# Patient Record
Sex: Male | Born: 1965 | Race: Black or African American | Hispanic: No | Marital: Married | Smoking: Former smoker
Health system: Southern US, Community
[De-identification: ages and names within clinical notes are randomized; demographics above are authoritative.]

## PROBLEM LIST (undated history)

## (undated) DIAGNOSIS — M109 Gout, unspecified: Secondary | ICD-10-CM

## (undated) DIAGNOSIS — E119 Type 2 diabetes mellitus without complications: Secondary | ICD-10-CM

## (undated) DIAGNOSIS — E785 Hyperlipidemia, unspecified: Secondary | ICD-10-CM

## (undated) DIAGNOSIS — L0390 Cellulitis, unspecified: Secondary | ICD-10-CM

## (undated) DIAGNOSIS — I10 Essential (primary) hypertension: Secondary | ICD-10-CM

## (undated) DIAGNOSIS — B9562 Methicillin resistant Staphylococcus aureus infection as the cause of diseases classified elsewhere: Secondary | ICD-10-CM

## (undated) DIAGNOSIS — G4733 Obstructive sleep apnea (adult) (pediatric): Secondary | ICD-10-CM

## (undated) HISTORY — DX: Gout, unspecified: M10.9

## (undated) HISTORY — PX: SURGERY SCROTAL / TESTICULAR: SUR1316

## (undated) HISTORY — DX: Essential (primary) hypertension: I10

## (undated) HISTORY — DX: Cellulitis, unspecified: L03.90

## (undated) HISTORY — DX: Methicillin resistant Staphylococcus aureus infection as the cause of diseases classified elsewhere: B95.62

## (undated) HISTORY — PX: APPENDECTOMY: SHX54

## (undated) HISTORY — DX: Obstructive sleep apnea (adult) (pediatric): G47.33

## (undated) HISTORY — DX: Hyperlipidemia, unspecified: E78.5

## (undated) HISTORY — DX: Type 2 diabetes mellitus without complications: E11.9

---

## 1999-07-26 HISTORY — PX: OTHER SURGICAL HISTORY: SHX169

## 2009-06-16 ENCOUNTER — Emergency Department (HOSPITAL_COMMUNITY): Admission: EM | Admit: 2009-06-16 | Discharge: 2009-06-16 | Payer: Self-pay | Admitting: Emergency Medicine

## 2009-07-16 ENCOUNTER — Emergency Department (HOSPITAL_COMMUNITY): Admission: EM | Admit: 2009-07-16 | Discharge: 2009-07-16 | Payer: Self-pay | Admitting: Emergency Medicine

## 2009-07-30 ENCOUNTER — Ambulatory Visit: Payer: Self-pay | Admitting: Internal Medicine

## 2009-07-30 ENCOUNTER — Encounter (INDEPENDENT_AMBULATORY_CARE_PROVIDER_SITE_OTHER): Payer: Self-pay | Admitting: Dermatology

## 2009-07-30 DIAGNOSIS — S99929A Unspecified injury of unspecified foot, initial encounter: Secondary | ICD-10-CM

## 2009-07-30 DIAGNOSIS — E785 Hyperlipidemia, unspecified: Secondary | ICD-10-CM

## 2009-07-30 DIAGNOSIS — E1165 Type 2 diabetes mellitus with hyperglycemia: Secondary | ICD-10-CM

## 2009-07-30 DIAGNOSIS — R252 Cramp and spasm: Secondary | ICD-10-CM | POA: Insufficient documentation

## 2009-07-30 DIAGNOSIS — I10 Essential (primary) hypertension: Secondary | ICD-10-CM

## 2009-07-30 DIAGNOSIS — E118 Type 2 diabetes mellitus with unspecified complications: Secondary | ICD-10-CM

## 2009-07-30 DIAGNOSIS — R3911 Hesitancy of micturition: Secondary | ICD-10-CM | POA: Insufficient documentation

## 2009-07-30 DIAGNOSIS — S8990XA Unspecified injury of unspecified lower leg, initial encounter: Secondary | ICD-10-CM

## 2009-07-30 DIAGNOSIS — S99919A Unspecified injury of unspecified ankle, initial encounter: Secondary | ICD-10-CM

## 2009-07-30 DIAGNOSIS — R740 Nonspecific elevation of levels of transaminase and lactic acid dehydrogenase [LDH]: Secondary | ICD-10-CM

## 2009-07-30 LAB — CONVERTED CEMR LAB: Hgb A1c MFr Bld: 7.8 %

## 2009-07-31 LAB — CONVERTED CEMR LAB
ALT: 97 units/L — ABNORMAL HIGH (ref 0–53)
Albumin: 4.6 g/dL (ref 3.5–5.2)
BUN: 15 mg/dL (ref 6–23)
CO2: 21 meq/L (ref 19–32)
Calcium: 9.6 mg/dL (ref 8.4–10.5)
Creatinine, Ser: 0.92 mg/dL (ref 0.40–1.50)
Glucose, Bld: 151 mg/dL — ABNORMAL HIGH (ref 70–99)
Microalb Creat Ratio: 3.5 mg/g (ref 0.0–30.0)
PSA: 0.44 ng/mL (ref 0.10–4.00)
Potassium: 4.6 meq/L (ref 3.5–5.3)
Total Protein: 7.3 g/dL (ref 6.0–8.3)

## 2009-09-08 ENCOUNTER — Encounter (INDEPENDENT_AMBULATORY_CARE_PROVIDER_SITE_OTHER): Payer: Self-pay | Admitting: Dermatology

## 2009-09-18 ENCOUNTER — Ambulatory Visit: Payer: Self-pay | Admitting: Internal Medicine

## 2009-09-21 ENCOUNTER — Ambulatory Visit: Payer: Self-pay | Admitting: Internal Medicine

## 2009-09-22 LAB — CONVERTED CEMR LAB
Cholesterol: 186 mg/dL (ref 0–200)
HDL: 40 mg/dL (ref >39)
LDL Cholesterol: 132 mg/dL — ABNORMAL HIGH (ref 0–99)
Triglycerides: 71 mg/dL (ref <150)

## 2009-09-25 ENCOUNTER — Encounter (INDEPENDENT_AMBULATORY_CARE_PROVIDER_SITE_OTHER): Payer: Self-pay | Admitting: Dermatology

## 2009-10-07 ENCOUNTER — Telehealth (INDEPENDENT_AMBULATORY_CARE_PROVIDER_SITE_OTHER): Payer: Self-pay | Admitting: Dermatology

## 2009-10-07 ENCOUNTER — Ambulatory Visit: Payer: Self-pay | Admitting: Infectious Diseases

## 2009-10-07 DIAGNOSIS — S025XXA Fracture of tooth (traumatic), initial encounter for closed fracture: Secondary | ICD-10-CM | POA: Insufficient documentation

## 2009-10-07 LAB — CONVERTED CEMR LAB
ALT: 31 units/L (ref 0–53)
AST: 16 units/L (ref 0–37)
Alkaline Phosphatase: 99 units/L (ref 39–117)
Calcium: 8.8 mg/dL (ref 8.4–10.5)
Glucose, Bld: 236 mg/dL — ABNORMAL HIGH (ref 70–99)
Hgb A1c MFr Bld: 8.2 %
Sodium: 139 meq/L (ref 135–145)
Total Protein: 6.8 g/dL (ref 6.0–8.3)

## 2009-10-08 ENCOUNTER — Telehealth: Payer: Self-pay | Admitting: *Deleted

## 2009-12-25 ENCOUNTER — Ambulatory Visit: Payer: Self-pay | Admitting: Internal Medicine

## 2009-12-25 LAB — CONVERTED CEMR LAB
Blood Glucose, Fingerstick: 545
Calcium: 9 mg/dL (ref 8.4–10.5)
Glucose, Bld: 547 mg/dL — ABNORMAL HIGH (ref 70–99)
Hgb A1c MFr Bld: >14 %
Potassium: 4.3 meq/L (ref 3.5–5.3)
Sodium: 130 meq/L — ABNORMAL LOW (ref 135–145)

## 2009-12-29 ENCOUNTER — Telehealth (INDEPENDENT_AMBULATORY_CARE_PROVIDER_SITE_OTHER): Payer: Self-pay | Admitting: *Deleted

## 2010-08-15 ENCOUNTER — Encounter: Payer: Self-pay | Admitting: Dermatology

## 2010-08-24 NOTE — Miscellaneous (Signed)
Summary: HIPAA Restrictions  HIPAA Restrictions   Imported By: Florinda Marker 07/30/2009 16:24:04  _____________________________________________________________________  External Attachment:    Type:   Image     Comment:   External Document
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
## 2010-08-24 NOTE — Letter (Signed)
Summary: [facility] Medical Consultation  [facility] Medical Consultation   Imported By: Florinda Marker 09/10/2009 16:19:37  _____________________________________________________________________  External Attachment:    Type:   Image     Comment:   External Document

## 2010-08-24 NOTE — Progress Notes (Signed)
----   Converted from flag ---- ---- 10/08/2009 8:54 AM, Chinita Pester RN wrote: I called radiology and cancelled [place] per Dr. Doreen Beam.  ---- 10/08/2009 7:04 AM, Aris Lot MD wrote: Rivka Barbara, I am cancelling the RUQ [place] on this patient as his LFTs are normal. I will call him to make him aware of this. Thanks, Lorelle Gibbs ------------------------------

## 2010-08-24 NOTE — Progress Notes (Signed)
Summary: lipids/ hla  Phone Note Other Incoming   Summary of Call: pt presents c/o liver being bad, wants to see dr Scot Dock today, explained that it is not possible, appt made w/ dr Doreen Beam, explained hyperlipidemia, pt concerned Initial call taken by: Marin Roberts RN,  October 07, 2009 1:38 PM

## 2010-08-24 NOTE — Progress Notes (Signed)
Summary: diabetes support/follow up o fhigh blood sugar/dmr  Phone Note Outgoing Call   Call placed by: Jamison Neighbor RD,CDE,  December 29, 2009 9:24 AM Summary of Call: CDE saw patient on 12/25/09 to discuss insulin and self monitoring of blood glucoes/meter. left message for patient to call with questions or concerns.     Appended Document: diabetes support/follow up o fhigh blood sugar/dmr    Phone Note Call from Patient   Caller: Patient Summary of Call: Patient returned call: says he got one vial, had a 62 in middle of night, ate peanutbutter sandwich and was okay. He reports most blood sugars stil high in 200s. Expresses stress in learningn to cope with his diabetes, "frustrating" mentioned needing ot eat better, more balanced meals. Would like  information left at front desk on carbohydrates, meal planning. He plans to come by today to pick it up and hand in papers ot [facility].  Informaiton on carb counting, plate method and general diabetes left at front desk with CDE card with phone number.
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
## 2010-08-24 NOTE — Assessment & Plan Note (Signed)
Summary: f/u on hyperlipidemia/pcp-Justin Anthony/hla   Vital Signs:  Patient profile:   45 year old male Height:      67 inches (170.18 cm) Weight:      179.0 pounds (81.36 kg) BMI:     28.14 Temp:     97.4 degrees F oral Pulse rate:   81 / minute BP sitting:   131 / 89  (right arm)  Vitals Entered By: Chinita Pester RN (October 07, 2009 2:59 PM) CC: Needs to talk to MD about his "liver being damaged". Is Patient Diabetic? Yes Did you bring your meter with you today? No Pain Assessment Patient in pain? no      Nutritional Status BMI of 25 - 29 = overweight CBG Result 155  Have you ever been in a relationship where you felt threatened, hurt or afraid?No   Does patient need assistance? Functional Status Self care Ambulation Normal   Primary Care Provider:  Aris Lot MD  CC:  Needs to talk to MD about his "liver being damaged"..  History of Present Illness: 45 yo man with PMH as listed below who requested a walk-in appointment today to followup because he thinks his "liver is going bad."  Increased transaminases: Patient was new to clinic and had Cmet checked 07-2009 that showed AST 40, ALT 97, alk phos WNL. No etiologies could be detected as he was not using any meds or alcohol, had no history of liver disease, had no history of hepatitis. He did return for a followup appointment with Dr. Scot Dock at which time hepatitis panel showed that he is Hep B surface antigen, hep C antibody negative. HIV was also non-reactive. **On further history he tells me that he was taking an OTC med for erectile stamina called Stamina Rx and that although he was not currently taking Lipitor prior to when his LFTs were checked before, he tells me that he was taking it months before and he wonders if these 2 substances could have caused his LFTs to be elevated.    Hyperlipidemia: He had a lipid panel checked at last visit that showed that total chol 186, tri 71, HDL 40, LDL 132.   DMII: Dr. Scot Dock  asked him to use metformin 1000BID but he wanted to continue to use 500BID as higher doses had caused GI side effects. Last A1c was in january.   Difficulty Urinating: At last visit he wanted me to check his PSA because he had some difficulty urinating. PSA was normal. He says that this difficulty urinating has resolved.   Broken teeth: Patient has some broken teeth that are not really bothering him but that he is concerned about and that he would like a dentist to examine. He asks if there are any dental resources in the community.   Depression History:      The patient denies a depressed mood most of the day and a diminished interest in his usual daily activities.         Preventive Screening-Counseling & Management  Alcohol-Tobacco     Alcohol drinks/day: 0     Smoking Status: quit     Year Quit: 8-010  Caffeine-Diet-Exercise     Does Patient Exercise: no  Problems Prior to Update: 1)  Transaminases, Serum, Elevated  (ICD-790.4) 2)  Leg Cramps, Nocturnal  (ICD-729.82) 3)  Foot Injury, Left  (ICD-959.7) 4)  Urinary Hesitancy  (ICD-788.64) 5)  Hypertension, Benign  (ICD-401.1) 6)  Hyperlipidemia  (ICD-272.4) 7)  Dm  (ICD-250.00)  Current Medications (verified):  1)  Metformin Hcl 500 Mg Tabs (Metformin Hcl) .... Take 1 Tablet By Mouth Twice A Day 2)  Lisinopril 5 Mg Tabs (Lisinopril) .... Take 1 Tablet By Mouth Once A Day  Allergies: 1)  ! Morphine 2)  ! Benadryl  Review of Systems  The patient denies anorexia, fever, weight loss, weight gain, vision loss, decreased hearing, hoarseness, chest pain, syncope, dyspnea on exertion, peripheral edema, prolonged cough, headaches, hemoptysis, abdominal pain, melena, hematochezia, severe indigestion/heartburn, hematuria, incontinence, genital sores, muscle weakness, suspicious skin lesions, transient blindness, difficulty walking, depression, unusual weight change, abnormal bleeding, and enlarged lymph nodes.    Physical  Exam  General:  alert, well-developed, well-nourished, and well-hydrated.   Head:  normocephalic and atraumatic.   Eyes:  vision grossly intact, pupils equal, pupils round, and pupils reactive to light.   Ears:  no external deformities.   Nose:  no external deformity.   Mouth:  Patient has broken teeth in the front with no signs of infection. Lungs:  normal respiratory effort, no intercostal retractions, no accessory muscle use, normal breath sounds, no crackles, and no wheezes.   Heart:  normal rate, regular rhythm, no murmur, no gallop, and no rub.   Abdomen:  soft, non-tender, normal bowel sounds, and no hepatomegaly.   Extremities:  no edema Neurologic:  alert & oriented X3, cranial nerves II-XII intact, strength normal in all extremities, and sensation intact to light touch.   Psych:  Oriented X3, memory intact for recent and remote, normally interactive, good eye contact, not anxious appearing, and not depressed appearing.     Impression & Recommendations:  Problem # 1:  TRANSAMINASES, SERUM, ELEVATED (ICD-790.4) See HPI. Discussed case with Dr. Sampson Goon.  Hep panel and HIV negative. Not using ETOH. Plan to recheck LFTs today and order RUQ [place] to further eval.  I have asked that he stop taking this OTC Stamina RX stamina medicine since I am not sure if this could contribute to LFT abnormalities. He voiced understanding.    **I originally ordered RUQ [place] but have cancelled it now that I see his LFTs are normal. These can just be monitored at next visit.   Orders: T-Comprehensive Metabolic Panel ([id number])  Problem # 2:  HYPERLIPIDEMIA (ICD-272.4) LDL 132. Plan last visit was to start statin but I will wait until we have further evaluated his liver. This can be addressed at his next visit.  Problem # 3:  Preventive Health Care (ICD-V70.0) Refuses flu, pneumovax today. Says last tetanus was 2006. I do not have physical records of this but he says he is having his records sent  from his last doctor in [place].   Problem # 4:  BROKEN TOOTH (UJW-119.14) I gave him info for a reduced price dental clinic in [place]. No signs of infection today.   Problem # 5:  DM (ICD-250.00) Last A1c in January. Says he has been using metformin. Will check A1c today.  He refuses eye referral today.  **A1c 8.2. From documentation that his dentist sent, he was not taking his meds relaibly. He now has refills of his meds. Will recheck his A1c in 3 months with plans to add sulfonylurea if no improvement as he does not want his dose of metformin increased.   His updated medication list for this problem includes:    Metformin Hcl 500 Mg Tabs (Metformin hcl) .Marland Kitchen... Take 1 tablet by mouth twice a day    Lisinopril 5 Mg Tabs (Lisinopril) .Marland Kitchen... Take 1 tablet by mouth once  a day  Orders: T-Hgb A1C (in-house) ([id number])  Problem # 6:  HYPERTENSION, BENIGN (ICD-401.1) Continue Lisinopril. Checking metabolic panel today since I want to see what LFTs are anyway.   His updated medication list for this problem includes:    Lisinopril 5 Mg Tabs (Lisinopril) .Marland Kitchen... Take 1 tablet by mouth once a day  Complete Medication List: 1)  Metformin Hcl 500 Mg Tabs (Metformin hcl) .... Take 1 tablet by mouth twice a day 2)  Lisinopril 5 Mg Tabs (Lisinopril) .... Take 1 tablet by mouth once a day  Other Orders: Capillary Blood Glucose/CBG ([id number])  Patient Instructions: 1)  Please schedule and keep a fu appointment in  ~3 months to recheck your A1c.  2)  You will be contacted about a followup appointment for an abdominal ultrasound to evaluate your liver. Prescriptions: LISINOPRIL 5 MG TABS (LISINOPRIL) Take 1 tablet by mouth once a day  #31 x 5   Entered and Authorized by:   Aris Lot MD   Signed by:   Aris Lot MD on 10/07/2009   Method used:   Print then Give to Patient   RxID:   [id number] METFORMIN HCL 500 MG TABS (METFORMIN HCL) Take 1 tablet by mouth twice a day  #62 x 5    Entered and Authorized by:   Aris Lot MD   Signed by:   Aris Lot MD on 10/07/2009   Method used:   Print then Give to Patient   RxID:   [id number]   Prevention & Chronic Care Immunizations   Influenza vaccine: Not documented   Influenza vaccine deferral: Refused  (10/07/2009)    Tetanus booster: Not documented   Td booster deferral: Refused  (09/18/2009)    Pneumococcal vaccine: Not documented   Pneumococcal vaccine deferral: Refused  (10/07/2009)  Other Screening   Smoking status: quit  (10/07/2009)  Diabetes Mellitus   HgbA1C: 8.2  (10/07/2009)    Eye exam: Not documented   Diabetic eye exam action/deferral: Refused  (10/07/2009)    Foot exam: Not documented   Foot exam action/deferral: Deferred   High risk foot: Not documented   Foot care education: Not documented    Urine microalbumin/creatinine ratio: 3.5  (07/30/2009)    Diabetes flowsheet reviewed?: Yes   Progress toward A1C goal: Unchanged  Lipids   Total Cholesterol: 186  (09/21/2009)   LDL: 132  (09/21/2009)   LDL Direct: Not documented   HDL: 40  (09/21/2009)   Triglycerides: 71  (09/21/2009)    SGOT (AST): 40  (07/30/2009)   SGPT (ALT): 97  (07/30/2009) CMP ordered    Alkaline phosphatase: 89  (07/30/2009)   Total bilirubin: 0.3  (07/30/2009)    Lipid flowsheet reviewed?: Yes   Progress toward LDL goal: Unchanged  Hypertension   Last Blood Pressure: 131 / 89  (10/07/2009)   Serum creatinine: 0.92  (07/30/2009)   Serum potassium 4.6  (07/30/2009) CMP ordered     Hypertension flowsheet reviewed?: Yes   Progress toward BP goal: Unchanged  Self-Management Support :   Personal Goals (by the next clinic visit) :     Personal A1C goal: 7  (10/07/2009)     Personal blood pressure goal: 130/80  (10/07/2009)     Personal LDL goal: 100  (10/07/2009)    Patient will work on the following items until the next clinic visit to reach self-care goals:     Medications and  monitoring: bring all of my medications to every visit, examine my feet every  day  (10/07/2009)     Eating: use fresh or frozen vegetables, eat foods that are low in salt  (10/07/2009)    Diabetes self-management support: Written self-care plan  (10/07/2009)   Diabetes care plan printed    Hypertension self-management support: Written self-care plan  (10/07/2009)   Hypertension self-care plan printed.    Lipid self-management support: Written self-care plan  (10/07/2009)   Lipid self-care plan printed.  Process Orders Check Orders Results:     [facility]: ABN not required for this insurance Tests Sent for requisitioning (October 08, 2009 4:04 PM):     10/07/2009: [facility] -- T-Comprehensive Metabolic Panel [[id number]] (signed)    Laboratory Results   Blood Tests   Date/Time Received: October 07, 2009 4:18 PM  Date/Time Reported: Burke Keels  October 07, 2009 4:18 PM   HGBA1C: 8.2%   (Normal Range: Non-Diabetic - 3-6%   Control Diabetic - 6-8%) CBG Random:: 155mg /dL

## 2010-08-24 NOTE — Miscellaneous (Signed)
Summary: [facility]   Imported By: Florinda Marker 09/25/2009 16:43:51  _____________________________________________________________________  External Attachment:    Type:   Image     Comment:   External Document

## 2010-08-24 NOTE — Assessment & Plan Note (Signed)
Summary: ACUTE-RESCH FROM 09/15/09   Vital Signs:  Patient profile:   45 year old male Height:      67 inches Weight:      173.1 pounds BMI:     27.21 Temp:     97.5 degrees F oral Pulse rate:   93 / minute BP sitting:   128 / 86  (right arm)  Vitals Entered By: Filomena Jungling NT II (September 18, 2009 2:45 PM) CC: DOCTOR NEEDS TO  INITIAL PAPER FOR TEETH WORK, BP WAS HIGH, HAS A COUGH Pain Assessment Patient in pain? no      Nutritional Status BMI of 25 - 29 = overweight  Have you ever been in a relationship where you felt threatened, hurt or afraid?No   Does patient need assistance? Functional Status Self care Ambulation Normal   Primary Care Provider:  Aris Lot MD  CC:  DOCTOR NEEDS TO  INITIAL PAPER FOR TEETH WORK, BP WAS HIGH, and HAS A COUGH.  History of Present Illness: 45 y/o man with DM, HTN, HLD comes to the clinic requsting clearnce for a dental procedure as his BP was found high at his dentist's office. He does not have any complaints. He had not taking his medication fr 3 days prior to going tothe dentist's appointment due to financial reasons.   Current Medications (verified): 1)  Metformin Hcl 500 Mg Tabs (Metformin Hcl) .... Take 1 Tablet By Mouth Twice A Day 2)  Lisinopril 5 Mg Tabs (Lisinopril) .... Take 1 Tablet By Mouth Once A Day 3)  Azithromycin 250 Mg Tabs (Azithromycin) .... Take 2 Tabs Today. Then Take 1 Tab Daily For Three Days  Allergies (verified): 1)  ! * Itch  Review of Systems  The patient denies anorexia, fever, weight loss, weight gain, vision loss, decreased hearing, hoarseness, chest pain, syncope, dyspnea on exertion, peripheral edema, prolonged cough, headaches, hemoptysis, abdominal pain, melena, hematochezia, severe indigestion/heartburn, hematuria, incontinence, genital sores, muscle weakness, suspicious skin lesions, transient blindness, difficulty walking, depression, unusual weight change, abnormal bleeding, enlarged lymph  nodes, angioedema, breast masses, and testicular masses.    Physical Exam  General:  alert, well-developed, well-nourished, and well-hydrated.   Head:  normocephalic and atraumatic.   Eyes:  vision grossly intact, pupils equal, pupils round, and pupils reactive to light.   Ears:  R ear normal and L ear normal.   Nose:  no external deformity.   Neck:  supple.   Lungs:  normal respiratory effort, no accessory muscle use, normal breath sounds, no crackles, and no wheezes.   Heart:  normal rate, regular rhythm, no murmur, no gallop, and no rub.   Abdomen:  soft, non-tender, normal bowel sounds, and no distention.   Pulses:  PT/DP pulses 2+ bilaterally Neurologic:  alert & oriented X3, cranial nerves II-XII intact, and strength normal in all extremities.     Impression & Recommendations:  Problem # 1:  HYPERTENSION, BENIGN (ICD-401.1) His B is stable today. It is 120/70 on manual measurement. Will emphasize  medication complinace.  His updated medication list for this problem includes:    Lisinopril 5 Mg Tabs (Lisinopril) .Marland Kitchen... Take 1 tablet by mouth once a day  BP today: 128/86 Prior BP: 126/80 (07/30/2009)  Labs Reviewed: K+: 4.6 (07/30/2009) Creat: : 0.92 (07/30/2009)     Problem # 2:  TRANSAMINASES, SERUM, ELEVATED (ICD-790.4) He had multiple heterosexual partner in the past and has recived BT previously. Will check HIV and Hep B and C. He is not on  statins. Is not taking OTC advil or tylenol. Lisinopril could cause some elevation but was started recently. His HbA1C was around 11 previously as per his hisotry, so NASH could be the cause. Not alcaholic (quit 6 yrs ago) No sigsn/symtoms of decompensated liver injury.  Continue to monitor. Provide reassurance.  Future Orders: T-HIV Antibody  (Reflex) ([id number]) ... 09/21/2009 T-Hepatitis C Antibody ([id number]) ... 09/21/2009 T-Hepatitis B Surface Antigen [id number]) ... 09/21/2009  Problem # 3:  HYPERLIPIDEMIA  (ICD-272.4) Not fasting today. Will put fuure order for FLP in 2 days. Patient agrees. Not on any meds currently.  Future Orders: T-Lipid Profile [id number]) ... 09/21/2009  Labs Reviewed: SGOT: 40 (07/30/2009)   SGPT: 97 (07/30/2009)  Problem # 4:  DM (ICD-250.00) HbA1C not at goal yet. Would like to increase his metformin to 1000-500 but patient is reluctant as he had GI distress in the past from 1000 two times a day metformin. Explained to him that his could be a transient effect but he would like to continue with 500 two times a day. Will recheck A1C in 3 months from previous result.  His updated medication list for this problem includes:    Metformin Hcl 500 Mg Tabs (Metformin hcl) .Marland Kitchen... Take 1 tablet by mouth twice a day    Lisinopril 5 Mg Tabs (Lisinopril) .Marland Kitchen... Take 1 tablet by mouth once a day  Labs Reviewed: Creat: 0.92 (07/30/2009)    Reviewed HgBA1c results: 7.8 (07/30/2009)  Complete Medication List: 1)  Metformin Hcl 500 Mg Tabs (Metformin hcl) .... Take 1 tablet by mouth twice a day 2)  Lisinopril 5 Mg Tabs (Lisinopril) .... Take 1 tablet by mouth once a day 3)  Azithromycin 250 Mg Tabs (Azithromycin) .... Take 2 tabs today. then take 1 tab daily for three days  Patient Instructions: 1)  Please schedule a follow-up appointment in 1 month. Come back on Monday for cholesterol and liver blood work. Come fasting at that time 2)  It is important that you exercise regularly at least 20 minutes 5 times a week. If you develop chest pain, have severe difficulty breathing, or feel very tired , stop exercising immediately and seek medical attention. 3)  You need to lose weight. Consider a lower calorie diet and regular exercise.  4)  Get plenty of rest, drink lots of clear liquids, and use Tylenol or Ibuprofen for fever and comfort. Return in 7-10 days if you're not better:sooner if you're feeling worse. 5)  Take 650-1000mg  of Tylenol every 4-6 hours as needed for relief of pain or  comfort of fever AVOID taking more than 4000mg   in a 24 hour period (can cause liver damage in higher doses). Prescriptions: AZITHROMYCIN 250 MG TABS (AZITHROMYCIN) Take 2 tabs today. Then take 1 tab daily for three days  #5 x 0   Entered and Authorized by:   Bethel Born MD   Signed by:   Bethel Born MD on 09/18/2009   Method used:   Print then Give to Patient   RxID:   [id number]   Prevention & Chronic Care Immunizations   Influenza vaccine: Not documented   Influenza vaccine deferral: Refused  (07/30/2009)    Tetanus booster: Not documented   Td booster deferral: Refused  (09/18/2009)    Pneumococcal vaccine: Not documented  Other Screening   Smoking status: quit  (07/30/2009)  Diabetes Mellitus   HgbA1C: 7.8  (07/30/2009)    Eye exam: Not documented   Diabetic eye exam action/deferral: Deferred  (07/30/2009)  Foot exam: Not documented   Foot exam action/deferral: Deferred   High risk foot: Not documented   Foot care education: Not documented    Urine microalbumin/creatinine ratio: 3.5  (07/30/2009)    Diabetes flowsheet reviewed?: Yes   Progress toward A1C goal: Unchanged  Lipids   Total Cholesterol: Not documented   LDL: Not documented   LDL Direct: Not documented   HDL: Not documented   Triglycerides: Not documented    SGOT (AST): 40  (07/30/2009)   SGPT (ALT): 97  (07/30/2009)   Alkaline phosphatase: 89  (07/30/2009)   Total bilirubin: 0.3  (07/30/2009)    Lipid flowsheet reviewed?: Yes   Progress toward LDL goal: Unchanged  Hypertension   Last Blood Pressure: 128 / 86  (09/18/2009)   Serum creatinine: 0.92  (07/30/2009)   Serum potassium 4.6  (07/30/2009)    Hypertension flowsheet reviewed?: Yes   Progress toward BP goal: At goal  Self-Management Support :    Patient will work on the following items until the next clinic visit to reach self-care goals:     Medications and monitoring: take my medicines every day  (09/18/2009)      Eating: drink diet soda or water instead of juice or soda, eat more vegetables, use fresh or frozen vegetables, eat foods that are low in salt, eat baked foods instead of fried foods, eat fruit for snacks and desserts, limit or avoid alcohol  (09/18/2009)    Diabetes self-management support: Education handout, Resources for patients handout  (09/18/2009)   Diabetes education handout printed    Hypertension self-management support: Education handout, Resources for patients handout  (09/18/2009)   Hypertension education handout printed    Lipid self-management support: Education handout, Resources for patients handout  (09/18/2009)     Lipid education handout printed      Resource handout printed.   Process Orders Check Orders Results:     [facility]: ABN not required for this insurance Tests Sent for requisitioning (September 19, 2009 12:47 PM):     09/21/2009: [facility] -- T-Lipid Profile [id number] (signed)     09/21/2009: [facility] -- T-HIV Antibody  (Reflex) [[id number]] (signed)     09/21/2009: [facility] -- T-Hepatitis C Antibody [[id number]] (signed)     09/21/2009: [facility] -- T-Hepatitis B Surface Antigen [[id number]] (signed)

## 2010-10-10 LAB — GLUCOSE, CAPILLARY: Glucose-Capillary: 182 mg/dL — ABNORMAL HIGH (ref 70–99)

## 2010-10-11 LAB — GLUCOSE, CAPILLARY: Glucose-Capillary: 545 mg/dL — ABNORMAL HIGH (ref 70–99)

## 2010-10-25 LAB — CBC
HCT: 41 % (ref 39.0–52.0)
Hemoglobin: 14.1 g/dL (ref 13.0–17.0)
MCHC: 34.3 g/dL (ref 30.0–36.0)
MCV: 86 fL (ref 78.0–100.0)
RBC: 4.77 MIL/uL (ref 4.22–5.81)
WBC: 3.8 10*3/uL — ABNORMAL LOW (ref 4.0–10.5)

## 2010-10-25 LAB — DIFFERENTIAL
Basophils Relative: 1 % (ref 0–1)
Eosinophils Absolute: 0.1 10*3/uL (ref 0.0–0.7)
Lymphs Abs: 1.6 10*3/uL (ref 0.7–4.0)
Monocytes Absolute: 0.5 10*3/uL (ref 0.1–1.0)
Monocytes Relative: 12 % (ref 3–12)
Neutrophils Relative %: 44 % (ref 43–77)

## 2010-10-25 LAB — POCT I-STAT, CHEM 8
BUN: 12 mg/dL (ref 6–23)
Calcium, Ion: 1.09 mmol/L — ABNORMAL LOW (ref 1.12–1.32)
Chloride: 106 mEq/L (ref 96–112)
Creatinine, Ser: 1 mg/dL (ref 0.4–1.5)
Glucose, Bld: 153 mg/dL — ABNORMAL HIGH (ref 70–99)
HCT: 42 % (ref 39.0–52.0)
Potassium: 4.4 mEq/L (ref 3.5–5.1)

## 2010-12-19 ENCOUNTER — Encounter: Payer: Self-pay | Admitting: Internal Medicine

## 2011-04-03 ENCOUNTER — Emergency Department (HOSPITAL_COMMUNITY)
Admission: EM | Admit: 2011-04-03 | Discharge: 2011-04-04 | Disposition: A | Discharge status: Home / Self Care | Payer: Self-pay | Attending: Emergency Medicine | Admitting: Emergency Medicine

## 2011-04-03 DIAGNOSIS — S81009A Unspecified open wound, unspecified knee, initial encounter: Secondary | ICD-10-CM | POA: Insufficient documentation

## 2011-04-03 DIAGNOSIS — W540XXA Bitten by dog, initial encounter: Secondary | ICD-10-CM | POA: Insufficient documentation

## 2011-04-03 DIAGNOSIS — E119 Type 2 diabetes mellitus without complications: Secondary | ICD-10-CM | POA: Insufficient documentation

## 2011-04-03 DIAGNOSIS — S91309A Unspecified open wound, unspecified foot, initial encounter: Secondary | ICD-10-CM | POA: Insufficient documentation

## 2011-04-03 DIAGNOSIS — Z8614 Personal history of Methicillin resistant Staphylococcus aureus infection: Secondary | ICD-10-CM | POA: Insufficient documentation

## 2011-04-03 DIAGNOSIS — I10 Essential (primary) hypertension: Secondary | ICD-10-CM | POA: Insufficient documentation

## 2011-04-03 DIAGNOSIS — Z23 Encounter for immunization: Secondary | ICD-10-CM | POA: Insufficient documentation

## 2011-04-03 DIAGNOSIS — S61409A Unspecified open wound of unspecified hand, initial encounter: Secondary | ICD-10-CM | POA: Insufficient documentation

## 2011-04-07 ENCOUNTER — Inpatient Hospital Stay (INDEPENDENT_AMBULATORY_CARE_PROVIDER_SITE_OTHER)
Admission: RE | Admit: 2011-04-07 | Discharge: 2011-04-07 | Disposition: A | Discharge status: Home / Self Care | Payer: Self-pay | Source: Ambulatory Visit | Attending: Family Medicine | Admitting: Family Medicine

## 2011-04-07 DIAGNOSIS — Z23 Encounter for immunization: Secondary | ICD-10-CM

## 2011-04-12 ENCOUNTER — Inpatient Hospital Stay (INDEPENDENT_AMBULATORY_CARE_PROVIDER_SITE_OTHER)
Admission: RE | Admit: 2011-04-12 | Discharge: 2011-04-12 | Disposition: A | Discharge status: Home / Self Care | Payer: Self-pay | Source: Ambulatory Visit | Attending: Family Medicine | Admitting: Family Medicine

## 2011-04-12 DIAGNOSIS — Z23 Encounter for immunization: Secondary | ICD-10-CM

## 2011-04-18 ENCOUNTER — Inpatient Hospital Stay (INDEPENDENT_AMBULATORY_CARE_PROVIDER_SITE_OTHER)
Admission: RE | Admit: 2011-04-18 | Discharge: 2011-04-18 | Disposition: A | Discharge status: Home / Self Care | Payer: Self-pay | Source: Ambulatory Visit | Attending: Family Medicine | Admitting: Family Medicine

## 2011-04-18 DIAGNOSIS — Z23 Encounter for immunization: Secondary | ICD-10-CM

## 2011-07-05 ENCOUNTER — Encounter: Payer: Self-pay | Admitting: Internal Medicine

## 2011-07-11 ENCOUNTER — Encounter: Payer: Self-pay | Admitting: Internal Medicine

## 2011-07-14 ENCOUNTER — Encounter: Payer: Self-pay | Admitting: Internal Medicine

## 2011-07-14 ENCOUNTER — Ambulatory Visit (INDEPENDENT_AMBULATORY_CARE_PROVIDER_SITE_OTHER): Payer: Self-pay | Admitting: Internal Medicine

## 2011-07-14 VITALS — BP 141/94 | HR 70 | Temp 97.5°F | Ht 67.0 in | Wt 170.4 lb

## 2011-07-14 DIAGNOSIS — E119 Type 2 diabetes mellitus without complications: Secondary | ICD-10-CM

## 2011-07-14 DIAGNOSIS — I10 Essential (primary) hypertension: Secondary | ICD-10-CM

## 2011-07-14 DIAGNOSIS — E785 Hyperlipidemia, unspecified: Secondary | ICD-10-CM

## 2011-07-14 LAB — GLUCOSE, CAPILLARY: Glucose-Capillary: 253 mg/dL — ABNORMAL HIGH (ref 70–99)

## 2011-07-14 MED ORDER — PRAVASTATIN SODIUM 40 MG PO TABS: 40.0000 mg | ORAL_TABLET | Freq: Every day | ORAL | Status: DC

## 2011-07-14 MED ORDER — GLIPIZIDE 10 MG PO TABS: 10.0000 mg | ORAL_TABLET | Freq: Two times a day (BID) | ORAL | Status: AC

## 2011-07-14 MED ORDER — METFORMIN HCL 1000 MG PO TABS: 1000.0000 mg | ORAL_TABLET | Freq: Two times a day (BID) | ORAL | Status: AC

## 2011-07-14 MED ORDER — LISINOPRIL 5 MG PO TABS: 5.0000 mg | ORAL_TABLET | Freq: Every day | ORAL | Status: DC

## 2011-07-14 NOTE — Progress Notes (Signed)
  Subjective:    Patient ID: Justin Anthony, male    DOB: 1966/02/15, 45 y.o.   MRN: [id number]  HPI Justin Anthony is a 45 year old man with past with history of uncontrolled DM 2, hypertension, hyperlipidemia who comes the clinic for chief complain of nonproductive cough for past 2 and half weeks.  Justin Anthony comes to the clinic after a hiatus of 1.5 years. Justin Anthony was seen last in June 2011- when his HbA1c was more than 14. Justin Anthony was advised to start insulin- but Justin Anthony did not have insurance and says that Justin Anthony had issues with his social life and so stopped following with our clinic. Justin Anthony did not see any other doctors meanwhile. Also Justin Anthony did not take any insulin or lisinopril or Pravachol for past one half years.  His HbA1c today is more than 14 again- for which I discussed with him in detail for more than 30 minutes- about possible complications of severely uncontrolled diabetes including kidney failure, CAD, PVD. Justin Anthony says that Justin Anthony understands, but does not want to take any insulin. Justin Anthony would rather just take pills and control it with diet- to which I explained him that it would be really hard without insulin to control his diabetes. But Justin Anthony was adamant.  Today Justin Anthony complains of nonproductive cough for past 2 and half weeks- denies any shortness of breath, sputum production, fever, chills, nausea, vomiting, headache.  Justin Anthony does complain of blurry vision for past few months- and attributes it to high sugars.  Justin Anthony tried to explain me that Justin Anthony knows why Justin Anthony his diabetes is bad- that Justin Anthony is eating a lot of sugars and Justin Anthony will try to control it with decreasing sugar intake- and Justin Anthony knows people with diabetes who does not need insulin and are just controlled on diet.  Justin Anthony denies any abdominal pain, diarrhea, urinary abnormalities.  Justin Anthony does not want to see diabetic educator or use insulin.  Review of Systems    As per history of present illness, all other systems reviewed and negative.  Objective:   Physical Exam  General: NAD HEENT:  PERRL, EOMI, no scleral icterus. MMM, no erythema or exudates. Cardiac: S1, S2, RRR, no rubs, murmurs or gallops Pulm: clear to auscultation bilaterally, moving normal volumes of air Abd: soft, nontender, nondistended, BS present Ext: warm and well perfused, no pedal edema Neuro: alert and oriented X3, cranial nerves II-XII grossly intact       Assessment & Plan:

## 2011-07-14 NOTE — Assessment & Plan Note (Signed)
Lab Results  Component Value Date   NA 130* 12/25/2009   K 4.3 12/25/2009   CL 97 12/25/2009   CO2 25 12/25/2009   BUN 13 12/25/2009   CREATININE 1.08 12/25/2009    BP Readings from Last 3 Encounters:  07/14/11 141/94  12/25/09 117/84  10/07/09 131/89    Assessment: Hypertension control:  mildly elevated  Progress toward goals:  deteriorated Barriers to meeting goals:  nonadherence to medications  Plan: Hypertension treatment:  continue current medications Restart lisinopril 5 mg daily. Check CMP today.

## 2011-07-14 NOTE — Assessment & Plan Note (Addendum)
Lab Results  Component Value Date   HGBA1C >14.0 07/14/2011   HGBA1C >14.0 12/25/2009   CREATININE 1.08 12/25/2009   MICROALBUR 0.50 07/30/2009   MICRALBCREAT 3.5 07/30/2009   CHOL 186 09/21/2009   HDL 40 09/21/2009   TRIG 71 09/21/2009    Last eye exam and foot exam: No results found for this basename: HMDIABEYEEXA, HMDIABFOOTEX    Assessment: Diabetes control: not controlled Progress toward goals: unchanged Barriers to meeting goals: nonadherence to medications and lack of understanding of disease management  Plan: Diabetes treatment: As described in history of present illness, patient  don't want to use insulin and just wants to try oral pills . I will start him on metformin thousand milligram by mouth twice a day and glipizide 10 mg by mouth twice a day. And see him back in 3-4 months for repeat HbA1c.  I will check lipid panel, CMP, urine microalbumin/creatinine ratio. Refer to: none and He denies talking with diabetic educator- even after one 30 minutes of discussion about his severe uncontrolled diabetes and possible complications. Instruction/counseling given: reminded to bring medications to each visit and other instruction/counseling: More than 30 minutes of counseling given about diabetes.

## 2011-07-14 NOTE — Assessment & Plan Note (Signed)
Lipid profile today. Restart Pravachol 40 mg daily

## 2011-07-14 NOTE — Patient Instructions (Addendum)
Make followup appointment in 3-4 months. If anything else comes up meanwhile please make an early appointment.  Start taking metformin 1000 mg twice a day and glipizide 10 mg twice a day before breakfast and dinner.  Start taking Pravachol- 40 mg daily for your cholesterol  Start taking lisinopril 5 mg daily for blood pressure and protecting kidneys.  I will give you a call if any of the lab tests are abnormal.  Apply for the orange card as soon as possible.

## 2011-07-15 LAB — COMPLETE METABOLIC PANEL WITH GFR
Albumin: 4.1 g/dL (ref 3.5–5.2)
Alkaline Phosphatase: 111 U/L (ref 39–117)
BUN: 12 mg/dL (ref 6–23)
CO2: 24 mEq/L (ref 19–32)
Creat: 0.89 mg/dL (ref 0.50–1.35)
GFR, Est African American: >89 mL/min
GFR, Est Non African American: >89 mL/min
Glucose, Bld: 276 mg/dL — ABNORMAL HIGH (ref 70–99)
Sodium: 137 mEq/L (ref 135–145)
Total Bilirubin: 0.4 mg/dL (ref 0.3–1.2)
Total Protein: 6.9 g/dL (ref 6.0–8.3)

## 2011-07-15 LAB — LIPID PANEL
Cholesterol: 172 mg/dL (ref 0–200)
HDL: 37 mg/dL — ABNORMAL LOW (ref >39)
LDL Cholesterol: 126 mg/dL — ABNORMAL HIGH (ref 0–99)
Total CHOL/HDL Ratio: 4.6 Ratio

## 2011-07-15 LAB — MICROALBUMIN / CREATININE URINE RATIO
Creatinine, Urine: 219 mg/dL
Microalb Creat Ratio: 6 mg/g (ref 0.0–30.0)

## 2011-10-12 ENCOUNTER — Encounter: Payer: Self-pay | Admitting: Internal Medicine

## 2012-07-23 ENCOUNTER — Encounter: Payer: Self-pay | Admitting: Internal Medicine

## 2012-07-23 ENCOUNTER — Ambulatory Visit (INDEPENDENT_AMBULATORY_CARE_PROVIDER_SITE_OTHER): Payer: BC Managed Care – PPO | Admitting: Internal Medicine

## 2012-07-23 VITALS — BP 138/84 | HR 77 | Temp 97.9°F | Ht 68.0 in | Wt 159.0 lb

## 2012-07-23 DIAGNOSIS — I10 Essential (primary) hypertension: Secondary | ICD-10-CM

## 2012-07-23 DIAGNOSIS — M25569 Pain in unspecified knee: Secondary | ICD-10-CM

## 2012-07-23 DIAGNOSIS — M109 Gout, unspecified: Secondary | ICD-10-CM | POA: Insufficient documentation

## 2012-07-23 DIAGNOSIS — E119 Type 2 diabetes mellitus without complications: Secondary | ICD-10-CM

## 2012-07-23 DIAGNOSIS — G4733 Obstructive sleep apnea (adult) (pediatric): Secondary | ICD-10-CM | POA: Insufficient documentation

## 2012-07-23 LAB — GLUCOSE, POCT (MANUAL RESULT ENTRY): POC Glucose: 342 mg/dl — AB (ref 70–99)

## 2012-07-23 NOTE — Assessment & Plan Note (Addendum)
Diagnosed with diabetes around 2000, history of poor compliance Per chart review.  Hemoglobin A1c 14 last year, off insulin for at least 2 years. CBG today 342 I discussed with patient implications of poorly controlled diabetes including CAD, stroke, blindness, ED, renal failure , need of dialysis and early death He is very reluctant to take insulin (" don't like shots") ; in the past metformin  caused GI distress. Plan: Labs Lantus initiation today Addendum:  as I was explaining the plan of care, the  Patient told me that he absolutely refuses to take insulin, I also mentioned Amaryl but again he said that he won't take it because "it cause me to shake ". I advise him we could start with a very low-dose to prevent hypoglycemia, he still refused. Plan: A1c, refer to endocrinology

## 2012-07-23 NOTE — Patient Instructions (Addendum)
Will let you know about your results  tomorrow
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
## 2012-07-23 NOTE — Assessment & Plan Note (Addendum)
2 months history of knee pain, description is atypical, sounds more like neuropathy (meralgia . paresthetica) than DJD Plan: Control diabetes Referral ortho ?

## 2012-07-23 NOTE — Assessment & Plan Note (Signed)
H/o  hypertension since 1996, not ambulatory BPs, on no medications for at least one year. Plan: Labs Reassess and return to the office

## 2012-07-24 LAB — CBC WITH DIFFERENTIAL/PLATELET
Basophils Relative: 0.2 % (ref 0.0–3.0)
Eosinophils Relative: 2.2 % (ref 0.0–5.0)
HCT: 44.7 % (ref 39.0–52.0)
Hemoglobin: 15 g/dL (ref 13.0–17.0)
Lymphs Abs: 1.5 10*3/uL (ref 0.7–4.0)
MCHC: 33.6 g/dL (ref 30.0–36.0)
MCV: 86.3 fl (ref 78.0–100.0)
Monocytes Absolute: 0.3 10*3/uL (ref 0.1–1.0)
Neutro Abs: 2.3 10*3/uL (ref 1.4–7.7)
RBC: 5.18 Mil/uL (ref 4.22–5.81)
WBC: 4.2 10*3/uL — ABNORMAL LOW (ref 4.5–10.5)

## 2012-07-24 LAB — MICROALBUMIN / CREATININE URINE RATIO: Microalb, Ur: 1.4 mg/dL (ref 0.0–1.9)

## 2012-07-24 LAB — COMPREHENSIVE METABOLIC PANEL
ALT: 30 U/L (ref 0–53)
AST: 25 U/L (ref 0–37)
Alkaline Phosphatase: 100 U/L (ref 39–117)
BUN: 9 mg/dL (ref 6–23)
Creatinine, Ser: 0.9 mg/dL (ref 0.4–1.5)

## 2012-08-02 ENCOUNTER — Encounter: Payer: Self-pay | Admitting: Internal Medicine

## 2012-08-02 ENCOUNTER — Ambulatory Visit (INDEPENDENT_AMBULATORY_CARE_PROVIDER_SITE_OTHER): Payer: BC Managed Care – PPO | Admitting: Internal Medicine

## 2012-08-02 VITALS — BP 114/88 | HR 73 | Temp 98.3°F | Resp 16 | Ht 68.0 in | Wt 152.0 lb

## 2012-08-02 DIAGNOSIS — R21 Rash and other nonspecific skin eruption: Secondary | ICD-10-CM

## 2012-08-02 DIAGNOSIS — R300 Dysuria: Secondary | ICD-10-CM

## 2012-08-02 DIAGNOSIS — E118 Type 2 diabetes mellitus with unspecified complications: Secondary | ICD-10-CM

## 2012-08-02 DIAGNOSIS — E1165 Type 2 diabetes mellitus with hyperglycemia: Secondary | ICD-10-CM

## 2012-08-02 DIAGNOSIS — N489 Disorder of penis, unspecified: Secondary | ICD-10-CM

## 2012-08-02 MED ORDER — METFORMIN HCL ER 500 MG PO TB24: 1000.0000 mg | ORAL_TABLET | Freq: Two times a day (BID) | ORAL | Status: DC

## 2012-08-02 MED ORDER — SITAGLIPTIN PHOSPHATE 100 MG PO TABS: 100.0000 mg | ORAL_TABLET | Freq: Every day | ORAL | Status: DC

## 2012-08-02 NOTE — Patient Instructions (Addendum)
Please return in a month with your sugar. Call me with the meter brand covered by your insurance.
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
## 2012-08-03 ENCOUNTER — Telehealth: Payer: Self-pay | Admitting: Internal Medicine

## 2012-08-03 DIAGNOSIS — E1165 Type 2 diabetes mellitus with hyperglycemia: Secondary | ICD-10-CM

## 2012-08-03 LAB — URINALYSIS
Hgb urine dipstick: NEGATIVE
Leukocytes, UA: NEGATIVE
Nitrite: NEGATIVE
Specific Gravity, Urine: >1.03 — ABNORMAL HIGH (ref 1.005–1.030)
pH: 5 (ref 5.0–8.0)

## 2012-08-03 MED ORDER — CLOTRIMAZOLE 1 % EX CREA: TOPICAL_CREAM | Freq: Two times a day (BID) | CUTANEOUS | Status: DC

## 2012-08-03 MED ORDER — ONETOUCH ULTRASOFT LANCETS MISC: Status: DC

## 2012-08-03 MED ORDER — BLOOD GLUCOSE MONITORING SUPPL W/DEVICE KIT: PACK | Status: DC

## 2012-08-03 MED ORDER — GLUCOSE BLOOD VI STRP: ORAL_STRIP | Status: DC

## 2012-08-03 NOTE — Telephone Encounter (Signed)
Called pt to discuss test results - he mentioned his insurance would cover any meter+ lancets+strips we decide to use. Will send the Rx's to his pharmacy.

## 2012-08-03 NOTE — Telephone Encounter (Signed)
The patient called to report that the medication rx'd to him by Dr. Elvera Lennox today (08/03/12) is too expensive and he would like a return call to discuss other options.  Please call the patient at [phone].

## 2012-08-03 NOTE — Telephone Encounter (Signed)
He is referring to Januvia, not Lotrimin as I initially thought. It costs 300$ and he has to pay 70$ out of pocket but he cannot afford it. I think out only option now are samples - I asked him to come to the office and pick up Janumet XR - 28 tabs. He will come on Monday am.

## 2012-08-20 ENCOUNTER — Telehealth: Payer: Self-pay | Admitting: Internal Medicine

## 2012-08-20 DIAGNOSIS — A6002 Herpesviral infection of other male genital organs: Secondary | ICD-10-CM

## 2012-08-20 MED ORDER — ACYCLOVIR 400 MG PO TABS: 400.0000 mg | ORAL_TABLET | Freq: Three times a day (TID) | ORAL | Status: AC

## 2012-08-20 NOTE — Telephone Encounter (Signed)
Pt called back: - [facility] and [facility] with genital herpes - I was tx him with Lotrimin for presumed Candida infection, initially did better, then rash returned. - he stopped the Lotrimin, and would like a Rx of Zovirax - I directed him to PCP, but he would like to change his PCP - I will start him on Zovirax, but pt promised me he will get a new PCP in the next month so he can be followed for this and other med problems  Regarding his DM: - he is now taking Janumet XR (samples) 1000/100 mg and his sugars are lower: 170 this am, yesterday 188. He also tries to stay on the nr of carbs recommended - advised to go up in his Metformin dose by 500 mg daily and then by another 500 mg daily, as previously advised

## 2012-08-20 NOTE — Telephone Encounter (Signed)
CALLED PATIENT, NO ANSWER. LEFT MESSAGE THAT DR. GHERGHE WILL RETURN CALL.

## 2012-08-20 NOTE — Telephone Encounter (Signed)
The patient is requesting return call from Dr. Elvera Lennox.  Please call the patient at [phone].

## 2012-08-20 NOTE — Telephone Encounter (Signed)
Called pt back - no answer. Left VM to call me at [phone].

## 2012-09-03 ENCOUNTER — Encounter: Payer: Self-pay | Admitting: Internal Medicine

## 2012-09-03 ENCOUNTER — Ambulatory Visit (INDEPENDENT_AMBULATORY_CARE_PROVIDER_SITE_OTHER): Payer: BC Managed Care – PPO | Admitting: Internal Medicine

## 2012-09-03 VITALS — BP 122/88 | HR 86 | Temp 98.8°F | Resp 16 | Ht 68.0 in | Wt 153.0 lb

## 2012-09-03 DIAGNOSIS — E118 Type 2 diabetes mellitus with unspecified complications: Secondary | ICD-10-CM

## 2012-09-03 DIAGNOSIS — E1165 Type 2 diabetes mellitus with hyperglycemia: Secondary | ICD-10-CM

## 2012-09-03 NOTE — Patient Instructions (Addendum)
Please continue Janumer XR - i tablet with breakfast and 1 tablet with dinner. Please return in 1.5 months with your sugar log. Please call the [facility] office today to schedule a visit with your PCP. Telephone #: [phone]

## 2012-09-03 NOTE — Progress Notes (Signed)
Patient ID: Justin Anthony, male   DOB: August 20, 1965, 47 y.o.   MRN: [id number] Subjective:     Patient ID: Justin Anthony, male   DOB: [birth year], 47 y.o.   MRN: [id number]  HPI Mr. Justin Anthony is a 47 y/o AA man returning for f/u for DM2, uncontrolled, dx in 2000, insulin-dependent (but will not take insulin as he is afraid of needles), with complications (neuropathy).  Pt was dx with DM 14 years ago; he was  briefly on insulin 70/30 and at that time, his HbA1c was close to goal, but would not re-try it. He estimates that he was on medication for 3-4 years of all of the years since dx (14). At last visit, we started Metformin XR and Januvia. I gave him samples as he cannot afford the Januvia. He refused any type on insulin and also a VGo system.  He has continuously improving blood sugars, with an a.m. average of 220 3 weeks ago, 170 2 weeks ago, and 140 this past week. He initially checked sugars throughout the day, with elevated values before lunch, especially, but these improved in the last 2 weeks, too. He improved his diet, and tries to eat consistent amount of carbs for each meal.  He had increased urination with nocturia 2-3 x a night, large volumes and also had blurry vision. Both his nocturia and blurred vision resolved after starting diabetes medication. He tells me that he does have headaches and SOB since he started the meds. He could not tolerate the regular metformin, as it gave him nausea and diarrhea. He stopped the regular Metformin and is now just on combination Januvia 50 mg + Metformin XR 1000 mg daily.   Last eye exam was in 2008. No kidney disease. He has numbness and tingling in toes and fingers.   At last visit he had a rash on his penis and burning with urination. We tried Clotrimazole but w/o significant result. [facility] and he asked me to send Zovirax to his pharmacy, which I did. He promised that he would call and schedule an appt with a PCP as he does  not want to return to see Dr. Drue Novel anymore (he was offended by the honest discussion that Dr. Drue Novel had with him about possible complications of his diabetes).  I reviewed pt's medications, allergies, PMH, social hx, family hx and no changes required.  Review of Systems Constitutional: + weight loss - but this is stable from last visit, + fatigue but much improved, no subjective hyperthermia/hypothermia, decreased appetite Eyes: no more blurry vision, no xerophthalmia ENT: no sore throat, no nodules palpated in throat, no dysphagia/odynophagia, no hoarseness Cardiovascular: no CP/+ SOB/palpitations Respiratory: no cough/+ SOB Gastrointestinal: no N/V/D/C- but had nausea and rrheafrom regular Metformin Musculoskeletal: + muscle aches/+ joint aches, + bone pain, + joint swelling - left knee Skin: rash on penis, not resolved Neurological: no tremors/numbness/tingling/dizziness Psychiatric: no depression/anxiety GU: denies excessive urination Also see HPI Objective:   Physical Exam BP 122/88  Pulse 86  Temp(Src) 98.8 F (37.1 C) (Oral)  Resp 16  Ht 5\' 8"  (1.727 m)  Wt 153 lb (69.4 kg)  BMI 23.27 kg/m2  SpO2 97%     Wt Readings from Last 3 Encounters:  09/03/12 153 lb (69.4 kg)  08/02/12 152 lb (68.947 kg)  07/23/12 159 lb (72.122 kg)  Constitutional:  Normal weight, in NAD Eyes: PERRLA, EOMI, no exophthalmos ENT: moist mucous membranes, no thyromegaly, no cervical  lymphadenopathy Cardiovascular: RRR, No MRG Respiratory: CTA B Gastrointestinal: abdomen soft, NT, ND, BS+ Musculoskeletal: no deformities, strength intact in all 4, but he has some amyotrophy in thigh mm - pain at palpation on medial side L knee; also L knee pain Skin: moist, warm  Assessment:     1. DM2, very uncontrolled, insulin-dependent - noncompliance with insulin tx due to fear of needles - several med. intolerances: Amaryl > shaking (likely hypoglycemia), regular Metformin > nausea and diarrhea -  noncompliant with finger sticks in the past because of needle problem - but did well in last month  Plan:     1. DM2 - patient is doing much better, and he brings a sugar log that shows that he is checking his sugars 1-3 times a day along with adding comments about his diet and possible reasons why his sugars are high. - his sugars greatly improved and I congratulated the patient for his diabetic control and for keeping a good CBG log - his headache is probably due to his body getting used to lower sugars vs Januvia effect; I believe he should continue the medication as it appears to be working well for him and we really do not have any option for him in terms of oral meds (due to cost) - I gave him samples of JanuMet XR 50/1000 and I advised him to take twice a day with breakfast and dinner - I will see him back in about a month and a half, however he would need to check back with me in about a month for more samples - Will check hemoglobin A1c at next visit   2. Left knee pain - I strongly recommended that he calls the  [facility] and schedule an appointment with a new PCP for both his penile herpes in his left knee pain.  - It is possible that his left medial knee pain can be from muscle infarction due to uncontrolled diabetes. I explained that, if this is the case, the pain will resolve by itself and there is nothing else that we can do to speed up the process other than gaining control of his diabetes - however he might need referral to orthopedics, will await PCP recommendations

## 2012-10-30 ENCOUNTER — Ambulatory Visit (INDEPENDENT_AMBULATORY_CARE_PROVIDER_SITE_OTHER): Payer: BC Managed Care – PPO | Admitting: Internal Medicine

## 2012-10-30 ENCOUNTER — Other Ambulatory Visit: Payer: Self-pay | Admitting: Internal Medicine

## 2012-10-30 ENCOUNTER — Encounter: Payer: Self-pay | Admitting: Internal Medicine

## 2012-10-30 VITALS — BP 140/90 | HR 93 | Temp 98.0°F | Wt 143.0 lb

## 2012-10-30 DIAGNOSIS — E118 Type 2 diabetes mellitus with unspecified complications: Secondary | ICD-10-CM

## 2012-10-30 DIAGNOSIS — R634 Abnormal weight loss: Secondary | ICD-10-CM

## 2012-10-30 DIAGNOSIS — R61 Generalized hyperhidrosis: Secondary | ICD-10-CM

## 2012-10-30 DIAGNOSIS — M25562 Pain in left knee: Secondary | ICD-10-CM

## 2012-10-30 DIAGNOSIS — M549 Dorsalgia, unspecified: Secondary | ICD-10-CM

## 2012-10-30 DIAGNOSIS — R202 Paresthesia of skin: Secondary | ICD-10-CM

## 2012-10-30 DIAGNOSIS — R209 Unspecified disturbances of skin sensation: Secondary | ICD-10-CM

## 2012-10-30 LAB — CBC WITH DIFFERENTIAL/PLATELET
Basophils Relative: 0.8 % (ref 0.0–3.0)
Eosinophils Absolute: 0 10*3/uL (ref 0.0–0.7)
Hemoglobin: 14.7 g/dL (ref 13.0–17.0)
MCHC: 34 g/dL (ref 30.0–36.0)
MCV: 84.6 fl (ref 78.0–100.0)
Monocytes Absolute: 0.3 10*3/uL (ref 0.1–1.0)
Neutro Abs: 1.3 10*3/uL — ABNORMAL LOW (ref 1.4–7.7)
Neutrophils Relative %: 39 % — ABNORMAL LOW (ref 43.0–77.0)
RBC: 5.1 Mil/uL (ref 4.22–5.81)

## 2012-10-30 LAB — URINALYSIS
Nitrite: NEGATIVE
Total Protein, Urine: NEGATIVE
pH: 5.5 (ref 5.0–8.0)

## 2012-10-30 LAB — COMPREHENSIVE METABOLIC PANEL
ALT: 33 U/L (ref 0–53)
AST: 20 U/L (ref 0–37)
Calcium: 9.5 mg/dL (ref 8.4–10.5)
Chloride: 104 mEq/L (ref 96–112)
Creatinine, Ser: 0.9 mg/dL (ref 0.4–1.5)
Sodium: 140 mEq/L (ref 135–145)

## 2012-10-30 LAB — GLUCOSE, POCT (MANUAL RESULT ENTRY): POC Glucose: 136 mg/dl — AB (ref 70–99)
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
## 2012-10-30 NOTE — Patient Instructions (Addendum)
Keep taking the medications as you are doing --- Please get your x-ray at the other [facility]  office located at: [address] [place], across from [facility].  Please go to the basement, this is a walk-in facility, they are open from 8:30 to 5:30 PM. Phone number [phone].

## 2012-10-31 ENCOUNTER — Ambulatory Visit (INDEPENDENT_AMBULATORY_CARE_PROVIDER_SITE_OTHER)
Admission: RE | Admit: 2012-10-31 | Discharge: 2012-10-31 | Disposition: A | Discharge status: Home / Self Care | Payer: BC Managed Care – PPO | Source: Ambulatory Visit | Attending: Internal Medicine | Admitting: Internal Medicine

## 2012-10-31 DIAGNOSIS — M25562 Pain in left knee: Secondary | ICD-10-CM

## 2012-10-31 DIAGNOSIS — M549 Dorsalgia, unspecified: Secondary | ICD-10-CM

## 2012-10-31 DIAGNOSIS — R61 Generalized hyperhidrosis: Secondary | ICD-10-CM

## 2012-10-31 DIAGNOSIS — M25569 Pain in unspecified knee: Secondary | ICD-10-CM

## 2012-10-31 LAB — MANUAL DIFFERENTIAL
Atypical Lymphocytes Manual: 1 % — ABNORMAL HIGH
Bands Manual: 0 % (ref 0–5)
Blasts Manual: 0 %
Lymphocytes Manual: 52 % — ABNORMAL HIGH (ref 12–46)

## 2012-11-02 ENCOUNTER — Telehealth: Payer: Self-pay | Admitting: Internal Medicine

## 2012-11-02 DIAGNOSIS — R9389 Abnormal findings on diagnostic imaging of other specified body structures: Secondary | ICD-10-CM

## 2012-11-02 NOTE — Telephone Encounter (Addendum)
BMP, LFTs, HIV, RPR, vitamins all normal. CBC and blood smear smear show no abnormal cells, platelets were elevated. Back and knee x-rays normal Chest x-ray: 1. Mild hyperinflation can be seen with COPD. 2. Fairly dense nodular density projecting over the left 7th anterolateral rib may represent a granuloma or bone island. A pulmonary nodule cannot be definitively excluded. Comparison with prior chest radiographs would be helpful. In the absence of prior chest radiographs, follow-up PA and lateral views of the chest or CT chest without contrast could be performed.  Advise patient: Nothing in his labs account for symptoms, chest x-ray is abnormal, showing small spot. Recommend to do CT chest without , I entered the order.

## 2012-11-05 NOTE — Telephone Encounter (Signed)
Discussed with pt

## 2012-11-08 ENCOUNTER — Ambulatory Visit (INDEPENDENT_AMBULATORY_CARE_PROVIDER_SITE_OTHER)
Admission: RE | Admit: 2012-11-08 | Discharge: 2012-11-08 | Disposition: A | Discharge status: Home / Self Care | Payer: BC Managed Care – PPO | Source: Ambulatory Visit | Attending: Internal Medicine | Admitting: Internal Medicine

## 2012-11-08 DIAGNOSIS — R9389 Abnormal findings on diagnostic imaging of other specified body structures: Secondary | ICD-10-CM

## 2012-11-08 DIAGNOSIS — R918 Other nonspecific abnormal finding of lung field: Secondary | ICD-10-CM

## 2012-11-13 ENCOUNTER — Telehealth: Payer: Self-pay | Admitting: *Deleted

## 2012-11-13 NOTE — Telephone Encounter (Signed)
Message copied by Verdene Rio on Tue Nov 13, 2012  3:03 PM ------      Message from: Willow Ora E      Created: Sun Nov 11, 2012  3:50 PM       Will consider a PSA on RTC but recent DRE was normal.      Advise pt: CT showed no malignancy, recommend to RTC in 4 weeks from today.      Also he is due to see endocrinology, advise pt to call and schedule a visit ------

## 2012-11-13 NOTE — Telephone Encounter (Signed)
Needs to see Neurology as recommended. Please be sure He has an appointment. Needs to see endocrinology and followup w/ me as recommended.

## 2012-11-13 NOTE — Telephone Encounter (Signed)
Pt would like to know what he is to do about the pain. Pt is still c/o pain in top of thigh extending to groin area. .Please advise

## 2012-11-14 NOTE — Telephone Encounter (Signed)
Pt has an appt to see Dr. Smiley Houseman on 4.30.14. Pt would like to know if there is anything he can take for the pain until he see neurology. Please advise.

## 2012-11-14 NOTE — Telephone Encounter (Signed)
He complained of left knee pain, now is having pain in the groin, I need to see him, could he come tomorrow Thursday as an add on at 11.15?

## 2012-11-15 ENCOUNTER — Ambulatory Visit (INDEPENDENT_AMBULATORY_CARE_PROVIDER_SITE_OTHER): Payer: BC Managed Care – PPO | Admitting: Internal Medicine

## 2012-11-15 ENCOUNTER — Encounter: Payer: Self-pay | Admitting: Internal Medicine

## 2012-11-15 VITALS — BP 138/86 | HR 86 | Temp 98.2°F | Wt 138.0 lb

## 2012-11-15 DIAGNOSIS — R209 Unspecified disturbances of skin sensation: Secondary | ICD-10-CM

## 2012-11-15 DIAGNOSIS — R634 Abnormal weight loss: Secondary | ICD-10-CM

## 2012-11-15 DIAGNOSIS — E1165 Type 2 diabetes mellitus with hyperglycemia: Secondary | ICD-10-CM

## 2012-11-15 DIAGNOSIS — R202 Paresthesia of skin: Secondary | ICD-10-CM

## 2012-11-15 DIAGNOSIS — E118 Type 2 diabetes mellitus with unspecified complications: Secondary | ICD-10-CM

## 2012-11-15 MED ORDER — GABAPENTIN 300 MG PO CAPS: 300.0000 mg | ORAL_CAPSULE | Freq: Three times a day (TID) | ORAL | Status: DC
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
## 2012-11-15 NOTE — Telephone Encounter (Signed)
Left detailed msg for pt to return call.

## 2012-11-15 NOTE — Assessment & Plan Note (Signed)
diabetes, He d/c Januvia metformin 10/30/2012, thinks some of his symptoms are related to the medication, symptoms however have not decreased, he is reluctant to go back to take any medication for diabetes. Plan: We will arrange a visit with endocrinology.

## 2012-11-15 NOTE — Assessment & Plan Note (Signed)
  Generalized paresthesias, etiology unclear, has an appointment pending with neurology. Trial with Neurontin.

## 2012-11-15 NOTE — Assessment & Plan Note (Signed)
Weight loss Ongoing problem, labs and  CT of the chest were unrevealing. He has upper abdominal discomfort. Plan: CT Abdomen. GI referral

## 2012-11-15 NOTE — Telephone Encounter (Signed)
Spoke with pt, he is coming in today to see Dr. Drue Novel @ 1130a.

## 2012-11-15 NOTE — Patient Instructions (Addendum)
Neurontin 300 mg: Take one tablet at bedtime for one week, then one tablet twice a day for one week, then one tablet 3 times a day. Next visit with me 6 weeks, please schedule

## 2012-11-16 ENCOUNTER — Encounter: Payer: Self-pay | Admitting: Gastroenterology

## 2012-11-21 ENCOUNTER — Ambulatory Visit (INDEPENDENT_AMBULATORY_CARE_PROVIDER_SITE_OTHER): Payer: BC Managed Care – PPO | Admitting: Neurology

## 2012-11-21 ENCOUNTER — Ambulatory Visit (INDEPENDENT_AMBULATORY_CARE_PROVIDER_SITE_OTHER)
Admission: RE | Admit: 2012-11-21 | Discharge: 2012-11-21 | Disposition: A | Discharge status: Home / Self Care | Payer: BC Managed Care – PPO | Source: Ambulatory Visit | Attending: Internal Medicine | Admitting: Internal Medicine

## 2012-11-21 ENCOUNTER — Encounter: Payer: Self-pay | Admitting: Neurology

## 2012-11-21 VITALS — BP 130/78 | HR 86 | Temp 98.2°F | Resp 12 | Ht 67.0 in | Wt 139.0 lb

## 2012-11-21 DIAGNOSIS — E1144 Type 2 diabetes mellitus with diabetic amyotrophy: Secondary | ICD-10-CM

## 2012-11-21 DIAGNOSIS — E1149 Type 2 diabetes mellitus with other diabetic neurological complication: Secondary | ICD-10-CM

## 2012-11-21 DIAGNOSIS — R634 Abnormal weight loss: Secondary | ICD-10-CM

## 2012-11-21 DIAGNOSIS — G545 Neuralgic amyotrophy: Secondary | ICD-10-CM

## 2012-11-21 MED ORDER — AMITRIPTYLINE HCL 10 MG PO TABS: ORAL_TABLET | ORAL | Status: DC

## 2012-11-21 MED ORDER — METHYLPREDNISOLONE (PAK) 4 MG PO TABS: ORAL_TABLET | ORAL | Status: DC

## 2012-11-21 MED ORDER — IOHEXOL 300 MG/ML  SOLN
100.0000 mL | Freq: Once | INTRAMUSCULAR | Status: AC | PRN
  Administered 2012-11-21: 100 mL via INTRAVENOUS

## 2012-11-21 NOTE — Patient Instructions (Addendum)
Follow up with Dr. Smiley Houseman in one month.  Follow up with endocrinologist as soon as possible.
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
## 2012-11-27 ENCOUNTER — Ambulatory Visit (INDEPENDENT_AMBULATORY_CARE_PROVIDER_SITE_OTHER): Payer: BC Managed Care – PPO | Admitting: Internal Medicine

## 2012-11-27 ENCOUNTER — Encounter: Payer: Self-pay | Admitting: Internal Medicine

## 2012-11-27 VITALS — BP 124/82 | HR 88 | Temp 98.3°F | Resp 10 | Wt 138.0 lb

## 2012-11-27 DIAGNOSIS — E118 Type 2 diabetes mellitus with unspecified complications: Secondary | ICD-10-CM

## 2012-11-27 DIAGNOSIS — E1165 Type 2 diabetes mellitus with hyperglycemia: Secondary | ICD-10-CM

## 2012-11-27 LAB — HEMOGLOBIN A1C: Hgb A1c MFr Bld: 8.2 % — ABNORMAL HIGH (ref 4.6–6.5)

## 2012-11-27 MED ORDER — INSULIN PEN NEEDLE 31G X 8 MM MISC: Status: DC

## 2012-11-27 MED ORDER — INSULIN GLARGINE 100 UNIT/ML SOLOSTAR PEN: 12.0000 [IU] | PEN_INJECTOR | Freq: Every day | SUBCUTANEOUS | Status: DC

## 2012-11-27 NOTE — Progress Notes (Addendum)
Subjective:     Patient ID: Justin Anthony, male   DOB: September 07, 1965, 47 y.o.   MRN: [id number]  HPI Justin Anthony is a 47 y/o AA man returning for f/u for DM2, uncontrolled, dx in 2000, insulin-dependent (but will not take insulin as he is afraid of needles), with complications (neuropathy, amiotrophy). Last visit 3 mo ago, he missed his last appt.  Pt was dx with DM 14 years ago; he was  briefly on insulin 70/30 and at that time, his HbA1c was close to goal, but but recently he refused any type on insulin and also a VGo system. He has several po diabetes medication intolerances (to the most commonly used antidiabetics), and we cannot use more extensive medications because of his inability to pay.   We started metformin and Januvia at the beginning of the year, and initially he did really good and his medicines, although we had problems with the fact that he could only take metformin XR, and he relies on samples from our office, with inconsistent availability. He developed some abdominal pain especially in the left upper quadrant, and he ended up stopping all the DM medicines a month ago. He is currently off all diabetes medicines.    He checks sugars 2-3x a week - sugars have increased after he stopped his medicines, but they are still surprisingly well controlled: - am: 125-130 - pre-dinner: <140 He has changed his diet to include a lot of fruit.  The stomach pain continues, but a little better. His appetite is down. No nausea. Has constipation.  He continues to have pain in his left upper thigh. He has seen neurology (Dr. Smiley Houseman). Pt  thinks he has meralgia paresthetica, after he read about it online. He has amyotrophy. He has been given a prednisone taper, but he did not start it yet.  Last eye exam was in 2008 - I advised him several times to get the new eye exam, but he did not have a chance to do this. No kidney disease. He has numbness and tingling in toes and fingers.   I reviewed pt's  medications, allergies, PMH, social hx, family hx and no changes required, other than above.  Review of Systems Constitutional: + weight loss, no fatigue, no subjective hyperthermia/hypothermia, decreased appetite Eyes: no blurry vision, no xerophthalmia ENT: no sore throat, no nodules palpated in throat, no dysphagia/odynophagia, no hoarseness Cardiovascular: no CP/no SOB/palpitations Respiratory: no cough/no SOB Gastrointestinal: no N/V/D/C- but had nausea and rrheafrom regular Metformin Musculoskeletal: no muscle aches/joint aches/bone pain Skin: no rashes Neurological: no tremors/numbness/tingling/dizziness Psychiatric: no depression/anxiety  Objective:   Physical Exam BP 124/82  Pulse 88  Temp(Src) 98.3 F (36.8 C) (Oral)  Resp 10  Wt 138 lb (62.596 kg)  BMI 21.61 kg/m2  SpO2 98% Weight: 138 lb (62.596 kg)   Wt Readings from Last 3 Encounters:  11/27/12 138 lb (62.596 kg)  11/21/12 139 lb (63.05 kg)  11/15/12 138 lb (62.596 kg)  Constitutional:  thin, in NAD Eyes: PERRLA, EOMI, no exophthalmos ENT: moist mucous membranes, no thyromegaly, no cervical lymphadenopathy Cardiovascular: RRR, No MRG Respiratory: CTA B Gastrointestinal: abdomen soft, NT, ND, BS+ Musculoskeletal: no deformities, strength intact in all 4 Skin: moist, warm  Assessment:     1. DM2, uncontrolled, insulin-dependent, refused insulin in the past - noncompliance with insulin tx due to fear of needles - several med. intolerances:   Amaryl >> shaking (likely hypoglycemia)  regular Metformin >> nausea and diarrhea  Januvia >> stomachache  Metformin XR >> stomachache - noncompliant with finger sticks in the past  - poor finances  Plan:     1. DM2 - Pt's blood sugars are surprisingly well controlled on no medications. He denies increased thirst and urination, however he continues to lose weight. This has been investigated by PCP with a CT scan of the abdomen and chest, with no concerning  findings. He has diabetic amyotrophy, which can be associated with weight loss. - since he is on no diabetic medicines, but his abdominal pain persists despite stopping the medicines approximately a month ago. She will have an appointment with Dr. Russella Dar (GI) in 2 weeks. He is reticent to restart the medicines. We discussed about possible options and he brought in to discussion insulin - he appears more open to the idea especially since I told him that we can start with one injection a day. - We decided to start with Lantus 12 units at bedtime - I demonstrated how to use the Lantus pens, and sent the prescription to his pharmacy along with pen needles - given brochure regarding pen use and injection sites - I gave him new sugar logs and advised him to check his sugars at least twice a day - I will see him back in a month with his sugar log - I again advised him to join my chart - I also advised him to be back in about a week with his sugars - He will start the prednisone taper after the start of Lantus  2. Weight loss - commonly associated with  diabetic amyotrophy - due to his weight loss, I would check anti-pancreatic antibodies, to see if patient developed LADA - I am very surprised that his sugars are so good without any medicines.Marland Kitchen.(honeymoon period for LADA?)  Office Visit on 11/27/2012  Component Date Value Range Status  . Hemoglobin A1C 11/27/2012 8.2* 4.6 - 6.5 % Final   Glycemic Control Guidelines for People with Diabetes:Non Diabetic:  <6%Goal of Therapy: <7%Additional Action Suggested:  >8%   . Glutamic Acid Decarb Ab 11/27/2012 <1.0  <=1.0 U/mL Final  . Pancreatic Islet Cell Antibody 11/27/2012 <5  < 5 JDF Units Final   Comment:                             Laboratory Developed Test performed using a reagent labeled by                           the manufacturer as ASR Class I or ASR Class II (non-blood bank).                                                      This test(s) was  developed and its performance characteristics                           have been determined by [facility],                           [place], [place]. It has not been cleared or approved by the U.S.  Food and Drug Administration. The FDA has determined that such                           clearance or approval is not necessary. Performance                           characteristics refer to the analytical performance of the test.   Letter sent. - likely not LADA or DM1. Dramatic improvement of HbA1C.

## 2012-11-27 NOTE — Patient Instructions (Addendum)
Please start Lantus 12 units before bedtime. Check your sugars at least twice a day and write them down. Please return in 1 month with your sugar log.

## 2012-11-30 LAB — GLUTAMIC ACID DECARBOXYLASE AUTO ABS: Glutamic Acid Decarb Ab: <1 U/mL (ref <=1.0)

## 2012-12-04 LAB — ANTI-ISLET CELL ANTIBODY: Pancreatic Islet Cell Antibody: <5 JDF Units (ref <5)

## 2012-12-06 ENCOUNTER — Encounter: Payer: Self-pay | Admitting: Internal Medicine

## 2012-12-10 ENCOUNTER — Encounter: Payer: Self-pay | Admitting: Gastroenterology

## 2012-12-10 ENCOUNTER — Ambulatory Visit (INDEPENDENT_AMBULATORY_CARE_PROVIDER_SITE_OTHER): Payer: BC Managed Care – PPO | Admitting: Gastroenterology

## 2012-12-10 VITALS — BP 112/86 | HR 92 | Ht 67.0 in | Wt 139.5 lb

## 2012-12-10 DIAGNOSIS — R109 Unspecified abdominal pain: Secondary | ICD-10-CM

## 2012-12-10 DIAGNOSIS — R198 Other specified symptoms and signs involving the digestive system and abdomen: Secondary | ICD-10-CM

## 2012-12-10 DIAGNOSIS — R634 Abnormal weight loss: Secondary | ICD-10-CM

## 2012-12-10 MED ORDER — OMEPRAZOLE 20 MG PO CPDR: 20.0000 mg | DELAYED_RELEASE_CAPSULE | Freq: Every day | ORAL | Status: DC

## 2012-12-10 NOTE — Progress Notes (Signed)
History of Present Illness: This is a 47 year old male who relates upper abdominal pain, a loss of appetite, a 20 pound weight loss and thinner stools. He notes cramping and soreness in his upper abdomen. For a few weeks he had pain in his left abdomen but for the past several weeks the pain has been localized to his upper abdomen Occasionally he notices spasms in his upper abdominal wall muscles. He states his symptoms began after beginning Janumet but he has since discontinued this medication and his symptoms have persisted. His hemoglobin A1c was 15.4 in December and his most recent was 8.2. He states he currently is not following his blood sugars at home as he is out of blood glucose strips. Recent CT of the abdomen/pelvis was negative. Denies constipation, diarrhea, melena, hematochezia, nausea, vomiting, dysphagia, reflux symptoms, chest pain.  Review of Systems: Pertinent positive and negative review of systems were noted in the above HPI section. All other review of systems were otherwise negative.  Current Medications, Allergies, Past Medical History, Past Surgical History, Family History and Social History were reviewed in [employer] [facility] record.  Physical Exam: General: Well developed , well nourished, thin, no acute distress Head: Normocephalic and atraumatic Eyes:  sclerae anicteric, EOMI Ears: Normal auditory acuity Mouth: No deformity or lesions Neck: Supple, no masses or thyromegaly Lungs: Clear throughout to auscultation Heart: Regular rate and rhythm; no murmurs, rubs or bruits Abdomen: Soft, mild upper abdominal tenderness to deep palpation without rebound or guarding and non distended. No masses, hepatosplenomegaly or hernias noted. Normal Bowel sounds Rectal: Deferred to colonoscopy Musculoskeletal: Symmetrical with no gross deformities  Skin: No lesions on visible extremities Pulses:  Normal pulses noted Extremities: No clubbing, cyanosis, edema or  deformities noted Neurological: Alert oriented x 4, grossly nonfocal Cervical Nodes:  No significant cervical adenopathy Inguinal Nodes: No significant inguinal adenopathy Psychological:  Alert and cooperative. Wandering history. Normal mood and affect  Assessment and Recommendations:  1. Unexplained upper abdominal pain, anorexia, weight loss and change in stool caliber. Rule out colorectal neoplasms ulcer disease gastritis and other disorders. A component of his upper abdominal pain and cramping could be musculoskeletal. Begin omeprazole 20 mg daily. Obtain stool Hemoccults. The risks, benefits, and alternatives to colonoscopy with possible biopsy and possible polypectomy were discussed with the patient and they consent to proceed. The risks, benefits, and alternatives to endoscopy with possible biopsy and possible dilation were discussed with the patient and they consent to proceed.

## 2012-12-10 NOTE — Patient Instructions (Addendum)
It has been recommended to you by your physician that you have a(n) endoscopy/colonoscopy completed. Per your request, we did not schedule the procedure(s) today. Please contact our office at [phone] should you decide to have the procedure completed.    Samples of Omeprazole/Prilosec was given today. Please take one tablet by mouth once daily. If this works well for you it can be purchased over the counter.

## 2012-12-11 ENCOUNTER — Other Ambulatory Visit: Payer: Self-pay | Admitting: Gastroenterology

## 2012-12-11 DIAGNOSIS — K921 Melena: Secondary | ICD-10-CM

## 2012-12-11 NOTE — Progress Notes (Signed)
After leaving the office appt, Dr Russella Dar states he wants patient to have Hemoccult cards. Called patient and told him I will mail him Hemoccult cards and to fill them out and mail them back to [place] when finished.

## 2012-12-13 ENCOUNTER — Ambulatory Visit: Payer: BC Managed Care – PPO | Admitting: Internal Medicine

## 2012-12-19 ENCOUNTER — Encounter: Payer: BC Managed Care – PPO | Admitting: Gastroenterology

## 2012-12-21 ENCOUNTER — Ambulatory Visit: Payer: BC Managed Care – PPO | Admitting: Neurology

## 2012-12-27 ENCOUNTER — Encounter: Payer: BC Managed Care – PPO | Admitting: Gastroenterology

## 2013-01-01 ENCOUNTER — Ambulatory Visit: Payer: BC Managed Care – PPO | Admitting: Internal Medicine

## 2013-01-31 ENCOUNTER — Other Ambulatory Visit: Payer: Self-pay

## 2014-09-13 ENCOUNTER — Emergency Department (HOSPITAL_COMMUNITY)
Admission: EM | Admit: 2014-09-13 | Discharge: 2014-09-14 | Disposition: A | Discharge status: Home / Self Care | Payer: Self-pay | Attending: Emergency Medicine | Admitting: Emergency Medicine

## 2014-09-13 ENCOUNTER — Encounter (HOSPITAL_COMMUNITY): Payer: Self-pay

## 2014-09-13 DIAGNOSIS — Z8614 Personal history of Methicillin resistant Staphylococcus aureus infection: Secondary | ICD-10-CM | POA: Insufficient documentation

## 2014-09-13 DIAGNOSIS — I10 Essential (primary) hypertension: Secondary | ICD-10-CM | POA: Insufficient documentation

## 2014-09-13 DIAGNOSIS — Z794 Long term (current) use of insulin: Secondary | ICD-10-CM | POA: Insufficient documentation

## 2014-09-13 DIAGNOSIS — Z79899 Other long term (current) drug therapy: Secondary | ICD-10-CM | POA: Insufficient documentation

## 2014-09-13 DIAGNOSIS — Z87891 Personal history of nicotine dependence: Secondary | ICD-10-CM | POA: Insufficient documentation

## 2014-09-13 DIAGNOSIS — Z8669 Personal history of other diseases of the nervous system and sense organs: Secondary | ICD-10-CM | POA: Insufficient documentation

## 2014-09-13 DIAGNOSIS — E119 Type 2 diabetes mellitus without complications: Secondary | ICD-10-CM | POA: Insufficient documentation

## 2014-09-13 DIAGNOSIS — R0602 Shortness of breath: Secondary | ICD-10-CM | POA: Insufficient documentation

## 2014-09-13 DIAGNOSIS — M25462 Effusion, left knee: Secondary | ICD-10-CM | POA: Insufficient documentation

## 2014-09-13 DIAGNOSIS — R0789 Other chest pain: Secondary | ICD-10-CM | POA: Insufficient documentation

## 2014-09-13 DIAGNOSIS — Z7982 Long term (current) use of aspirin: Secondary | ICD-10-CM | POA: Insufficient documentation

## 2014-09-13 DIAGNOSIS — R079 Chest pain, unspecified: Secondary | ICD-10-CM

## 2014-09-13 LAB — I-STAT TROPONIN, ED: TROPONIN I, POC: 0.01 ng/mL (ref 0.00–0.08)

## 2014-09-13 LAB — CBC
HCT: 44.1 % (ref 39.0–52.0)
Hemoglobin: 15.3 g/dL (ref 13.0–17.0)
MCH: 29.5 pg (ref 26.0–34.0)
MCHC: 34.7 g/dL (ref 30.0–36.0)
MCV: 85 fL (ref 78.0–100.0)
PLATELETS: 373 10*3/uL (ref 150–400)
RBC: 5.19 MIL/uL (ref 4.22–5.81)
RDW: 12.6 % (ref 11.5–15.5)
WBC: 4 10*3/uL (ref 4.0–10.5)

## 2014-09-13 NOTE — ED Notes (Signed)
Patient reports intermittent left-sided chest pain that started yesterday.  Denies shortness of breath, nausea/vomiting.

## 2014-09-13 NOTE — ED Notes (Signed)
Patient was diagnosed with GERD 2 years ago and was prescribed prilosec which he does not take. Patient admits that he does not take any medications for his diabetes or hypertension. I attempted to educate that patient about complications and offered written material. Patient declined stating that he has heard it all before and just rather not be bothered with it.

## 2014-09-14 ENCOUNTER — Emergency Department (HOSPITAL_COMMUNITY): Payer: Self-pay

## 2014-09-14 LAB — D-DIMER, QUANTITATIVE (NOT AT ARMC)

## 2014-09-14 LAB — BASIC METABOLIC PANEL
Anion gap: 9 (ref 5–15)
BUN: 14 mg/dL (ref 6–23)
CALCIUM: 9.3 mg/dL (ref 8.4–10.5)
CO2: 25 mmol/L (ref 19–32)
Chloride: 100 mmol/L (ref 96–112)
Creatinine, Ser: 0.9 mg/dL (ref 0.50–1.35)
GFR calc Af Amer: >90 mL/min (ref >90)
Glucose, Bld: 225 mg/dL — ABNORMAL HIGH (ref 70–99)
Potassium: 3.7 mmol/L (ref 3.5–5.1)
Sodium: 134 mmol/L — ABNORMAL LOW (ref 135–145)

## 2014-09-14 LAB — TROPONIN I

## 2014-09-14 MED ORDER — ASPIRIN 81 MG PO CHEW: 81.0000 mg | CHEWABLE_TABLET | Freq: Every day | ORAL | Status: AC

## 2014-09-14 MED ORDER — METHOCARBAMOL 500 MG PO TABS: 1000.0000 mg | ORAL_TABLET | Freq: Three times a day (TID) | ORAL | Status: DC | PRN

## 2014-09-14 NOTE — ED Notes (Signed)
Patient transported to X-ray via stretcher Patient in NAD upon leaving ED for testing 

## 2014-09-14 NOTE — ED Notes (Signed)
Patient's wife wanting to know the results of the first lab work before the patient is stuck again for any labs. Patient's wife states" This place always wants to nickel and dime ya before anything gets done".
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
## 2014-09-14 NOTE — ED Notes (Signed)
ED Charge nurse made aware that patient has refused additional labs

## 2014-09-14 NOTE — ED Notes (Signed)
Patient rude and interruptive during DC process Patient would not let this nurse completely review DC instructions or obtain DC VS Patient stated that he wanted PIV taken out so he could leave Patient rolling his eyes and shaking his head when this nurse [facility] Patient then stated, "I didn't want to come here anyway." Patient in NAD at time of DC Patient refused to sign DC papers ED Charge aware

## 2014-09-14 NOTE — ED Notes (Signed)
Informed by phlebotomy that patient and pt's wife would like to speak with Dr. Ranae PalmsYelverton before additional labs are drawn Will make EDP aware

## 2014-09-14 NOTE — Discharge Instructions (Signed)
Chest Wall Pain Chest wall pain is pain in or around the bones and muscles of your chest. It may take up to 6 weeks to get better. It may take longer if you must stay physically active in your work and activities.  CAUSES  Chest wall pain may happen on its own. However, it may be caused by:  A viral illness like the flu.  Injury.  Coughing.  Exercise.  Arthritis.  Fibromyalgia.  Shingles. HOME CARE INSTRUCTIONS   Avoid overtiring physical activity. Try not to strain or perform activities that cause pain. This includes any activities using your chest or your abdominal and side muscles, especially if heavy weights are used.  Put ice on the sore area.  Put ice in a plastic bag.  Place a towel between your skin and the bag.  Leave the ice on for 15-20 minutes per hour while awake for the first 2 days.  Only take over-the-counter or prescription medicines for pain, discomfort, or fever as directed by your caregiver. SEEK IMMEDIATE MEDICAL CARE IF:   Your pain increases, or you are very uncomfortable.  You have a fever.  Your chest pain becomes worse.  You have new, unexplained symptoms.  You have nausea or vomiting.  You feel sweaty or lightheaded.  You have a cough with phlegm (sputum), or you cough up blood. MAKE SURE YOU:   Understand these instructions.  Will watch your condition.  Will get help right away if you are not doing well or get worse. Document Released: 07/11/2005 Document Revised: 10/03/2011 Document Reviewed: 03/07/2011 [facility] Patient Information 2015 [employer], [place]. This information is not intended to replace advice given to you by your health care provider. Make sure you discuss any questions you have with your health care provider.   Given your risk factors for coronary artery disease, you need to follow-up with a cardiologist. You may need further testing. Return immediately for worsening pain or for any concerns.

## 2014-09-14 NOTE — ED Notes (Signed)
MD at bedside. 

## 2015-09-28 ENCOUNTER — Emergency Department (HOSPITAL_COMMUNITY): Payer: Self-pay

## 2015-09-28 ENCOUNTER — Emergency Department (HOSPITAL_COMMUNITY)
Admission: EM | Admit: 2015-09-28 | Discharge: 2015-09-28 | Disposition: A | Discharge status: Home / Self Care | Payer: Self-pay | Attending: Emergency Medicine | Admitting: Emergency Medicine

## 2015-09-28 ENCOUNTER — Encounter (HOSPITAL_COMMUNITY): Payer: Self-pay | Admitting: Emergency Medicine

## 2015-09-28 DIAGNOSIS — Z8739 Personal history of other diseases of the musculoskeletal system and connective tissue: Secondary | ICD-10-CM | POA: Insufficient documentation

## 2015-09-28 DIAGNOSIS — Z8614 Personal history of Methicillin resistant Staphylococcus aureus infection: Secondary | ICD-10-CM | POA: Insufficient documentation

## 2015-09-28 DIAGNOSIS — W11XXXA Fall on and from ladder, initial encounter: Secondary | ICD-10-CM | POA: Insufficient documentation

## 2015-09-28 DIAGNOSIS — I10 Essential (primary) hypertension: Secondary | ICD-10-CM | POA: Insufficient documentation

## 2015-09-28 DIAGNOSIS — Z8669 Personal history of other diseases of the nervous system and sense organs: Secondary | ICD-10-CM | POA: Insufficient documentation

## 2015-09-28 DIAGNOSIS — Y9289 Other specified places as the place of occurrence of the external cause: Secondary | ICD-10-CM | POA: Insufficient documentation

## 2015-09-28 DIAGNOSIS — S92002A Unspecified fracture of left calcaneus, initial encounter for closed fracture: Secondary | ICD-10-CM | POA: Insufficient documentation

## 2015-09-28 DIAGNOSIS — S8012XA Contusion of left lower leg, initial encounter: Secondary | ICD-10-CM | POA: Insufficient documentation

## 2015-09-28 DIAGNOSIS — Y9389 Activity, other specified: Secondary | ICD-10-CM | POA: Insufficient documentation

## 2015-09-28 DIAGNOSIS — Z87891 Personal history of nicotine dependence: Secondary | ICD-10-CM | POA: Insufficient documentation

## 2015-09-28 DIAGNOSIS — Y998 Other external cause status: Secondary | ICD-10-CM | POA: Insufficient documentation

## 2015-09-28 DIAGNOSIS — E119 Type 2 diabetes mellitus without complications: Secondary | ICD-10-CM | POA: Insufficient documentation

## 2015-09-28 MED ORDER — OXYCODONE-ACETAMINOPHEN 5-325 MG PO TABS: 1.0000 | ORAL_TABLET | ORAL | Status: DC | PRN

## 2015-09-28 MED ORDER — OXYCODONE-ACETAMINOPHEN 5-325 MG PO TABS
2.0000 | ORAL_TABLET | Freq: Once | ORAL | Status: AC
  Administered 2015-09-28: 2 via ORAL
  Filled 2015-09-28: qty 2

## 2015-09-28 NOTE — ED Notes (Signed)
Pt to xray
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
## 2015-09-28 NOTE — Discharge Instructions (Signed)
Cast or Splint Care °Casts and splints support injured limbs and keep bones from moving while they heal. It is important to care for your cast or splint at home.   °HOME CARE INSTRUCTIONS °· Keep the cast or splint uncovered during the drying period. It can take 24 to 48 hours to dry if it is made of plaster. A fiberglass cast will dry in less than 1 hour. °· Do not rest the cast on anything harder than a pillow for the first 24 hours. °· Do not put weight on your injured limb or apply pressure to the cast until your health care provider gives you permission. °· Keep the cast or splint dry. Wet casts or splints can lose their shape and may not support the limb as well. A wet cast that has lost its shape can also create harmful pressure on your skin when it dries. Also, wet skin can become infected. °· Cover the cast or splint with a plastic bag when bathing or when out in the rain or snow. If the cast is on the trunk of the body, take sponge baths until the cast is removed. °· If your cast does become wet, dry it with a towel or a blow dryer on the cool setting only. °· Keep your cast or splint clean. Soiled casts may be wiped with a moistened cloth. °· Do not place any hard or soft foreign objects under your cast or splint, such as cotton, toilet paper, lotion, or powder. °· Do not try to scratch the skin under the cast with any object. The object could get stuck inside the cast. Also, scratching could lead to an infection. If itching is a problem, use a blow dryer on a cool setting to relieve discomfort. °· Do not trim or cut your cast or remove padding from inside of it. °· Exercise all joints next to the injury that are not immobilized by the cast or splint. For example, if you have a long leg cast, exercise the hip joint and toes. If you have an arm cast or splint, exercise the shoulder, elbow, thumb, and fingers. °· Elevate your injured arm or leg on 1 or 2 pillows for the first 1 to 3 days to decrease  swelling and pain. It is best if you can comfortably elevate your cast so it is higher than your heart. °SEEK MEDICAL CARE IF:  °· Your cast or splint cracks. °· Your cast or splint is too tight or too loose. °· You have unbearable itching inside the cast. °· Your cast becomes wet or develops a soft spot or area. °· You have a bad smell coming from inside your cast. °· You get an object stuck under your cast. °· Your skin around the cast becomes red or raw. °· You have new pain or worsening pain after the cast has been applied. °SEEK IMMEDIATE MEDICAL CARE IF:  °· You have fluid leaking through the cast. °· You are unable to move your fingers or toes. °· You have discolored (blue or white), cool, painful, or very swollen fingers or toes beyond the cast. °· You have tingling or numbness around the injured area. °· You have severe pain or pressure under the cast. °· You have any difficulty with your breathing or have shortness of breath. °· You have chest pain. °  °This information is not intended to replace advice given to you by your health care provider. Make sure you discuss any questions you have with your health care   provider.   Document Released: 07/08/2000 Document Revised: 05/01/2013 Document Reviewed: 01/17/2013 Elsevier Interactive Patient Education 2016 [employer] Calcaneal Fracture Repair There are many different ways of treating fractures of the large irregular bone in the foot that makes up the heel of the foot (calcaneus). Calcaneal fractures can be treated with:   Immobilization--The fracture is casted as it is without changing the positions of the fracture involved.   Closed reduction--The bones are manipulated back into position without opening the site of the fracture using surgery.   Open reduction and internal fixation--The fracture site is opened and the bone pieces are fixed into place with some type of hardware (such as a screw).   Primary arthrodesis--The joint has enough  damage that a procedure is done as the first treatment which will leave the joint permanently stiff. This will decrease function, however usually will leave the joint pain free. LET [facility] CARE PROVIDER KNOW ABOUT:  Any allergies you have.   All medicines you are taking, including vitamins, herbs, eye drops, creams, and over-the-counter medicines.   Previous problems you or members of your family have had with the use of anesthetics.  Any blood disorders you have.   Previous surgeries you have had.   Medical conditions you have.  RISKS AND COMPLICATIONS Generally, calcaneal fracture repair is a safe procedure. However, as with any procedure, complications can occur. Possible complications include:   Swelling of the foot and ankle.  Infection of the wound or bone.  Arthritis.  Chronic pain of the foot.  Nerve injury.  Blood clot in the legs or lungs. BEFORE THE PROCEDURE  Ask your health care provider about changing or stopping your regular medicines. You may need to stop taking certain medicines, such as aspirin or blood thinners, at least 1 week before the surgery.  X-rays and any imaging studies are reviewed with your healthcare provider. The surgeon will advise you on the best surgical approach to repair your fracture.  Do not eat or drink anything for at least 8 hours before the surgery or as directed by your health care provider.   If you smoke, do not smoke for at least 2 weeks before the surgery.   Make plans to have someone drive you home after the procedure. Also arrange for someone to help you with activities during recovery.  PROCEDURE   You will be given medicine to help you relax (sedative). You will then be given medicine to make you sleep through the procedure (general anesthetic). These medicines will be given through an IV access tube that is put into one of your veins.   A nerve block or numbing medicine (local anesthetic) may also be used to  keep you comfortable.  Once you are asleep, the foot will be cleaned and shaved if needed.  The surgeon may use a percutaneous or open technique for this surgery:  In the percutaneous approach, small cuts and pins are used to repair the fracture.  In the open technique, a cut is made along the outside of the foot and the bone pieces are placed back together with hardware. A drain may be left to collect fluid. It is removed 3-4 days after the procedure.  The surgeon then uses staples or stitches to close the incision or cuts. AFTER THE PROCEDURE  After surgery you will be taken to the recovery area where a nurse will watch and check your progress for 1-3 hours. Once you are awake, stable, and taking fluids well, and  if you do not have any other problems, you will be allowed to go home.  You will be given pain medicine if needed.  The IV access tube will be removed before you are discharged.   This information is not intended to replace advice given to you by your health care provider. Make sure you discuss any questions you have with your health care provider.   Document Released: 04/20/2005 Document Revised: 05/01/2013 Document Reviewed: 02/12/2013 Elsevier Interactive Patient Education 2016 [employer] Cryotherapy Cryotherapy means treatment with cold. Ice or gel packs can be used to reduce both pain and swelling. Ice is the most helpful within the first 24 to 48 hours after an injury or flare-up from overusing a muscle or joint. Sprains, strains, spasms, burning pain, shooting pain, and aches can all be eased with ice. Ice can also be used when recovering from surgery. Ice is effective, has very few side effects, and is safe for most people to use. PRECAUTIONS  Ice is not a safe treatment option for people with: Raynaud phenomenon. This is a condition affecting small blood vessels in the extremities. Exposure to cold may cause your problems to return. Cold hypersensitivity. There  are many forms of cold hypersensitivity, including: Cold urticaria. Red, itchy hives appear on the skin when the tissues begin to warm after being iced. Cold erythema. This is a red, itchy rash caused by exposure to cold. Cold hemoglobinuria. Red blood cells break down when the tissues begin to warm after being iced. The hemoglobin that carry oxygen are passed into the urine because they cannot combine with blood proteins fast enough. Numbness or altered sensitivity in the area being iced. If you have any of the following conditions, do not use ice until you have discussed cryotherapy with your caregiver: Heart conditions, such as arrhythmia, angina, or chronic heart disease. High blood pressure. Healing wounds or open skin in the area being iced. Current infections. Rheumatoid arthritis. Poor circulation. Diabetes. Ice slows the blood flow in the region it is applied. This is beneficial when trying to stop inflamed tissues from spreading irritating chemicals to surrounding tissues. However, if you expose your skin to cold temperatures for too long or without the proper protection, you can damage your skin or nerves. Watch for signs of skin damage due to cold. HOME CARE INSTRUCTIONS Follow these tips to use ice and cold packs safely. Place a dry or damp towel between the ice and skin. A damp towel will cool the skin more quickly, so you may need to shorten the time that the ice is used. For a more rapid response, add gentle compression to the ice. Ice for no more than 10 to 20 minutes at a time. The bonier the area you are icing, the less time it will take to get the benefits of ice. Check your skin after 5 minutes to make sure there are no signs of a poor response to cold or skin damage. Rest 20 minutes or more between uses. Once your skin is numb, you can end your treatment. You can test numbness by very lightly touching your skin. The touch should be so light that you do not see the skin  dimple from the pressure of your fingertip. When using ice, most people will feel these normal sensations in this order: cold, burning, aching, and numbness. Do not use ice on someone who cannot communicate their responses to pain, such as small children or people with dementia. HOW TO MAKE AN ICE PACK  Ice packs are the most common way to use ice therapy. Other methods include ice massage, ice baths, and cryosprays. Muscle creams that cause a cold, tingly feeling do not offer the same benefits that ice offers and should not be used as a substitute unless recommended by your caregiver. To make an ice pack, do one of the following: Place crushed ice or a bag of frozen vegetables in a sealable plastic bag. Squeeze out the excess air. Place this bag inside another plastic bag. Slide the bag into a pillowcase or place a damp towel between your skin and the bag. Mix 3 parts water with 1 part rubbing alcohol. Freeze the mixture in a sealable plastic bag. When you remove the mixture from the freezer, it will be slushy. Squeeze out the excess air. Place this bag inside another plastic bag. Slide the bag into a pillowcase or place a damp towel between your skin and the bag. SEEK MEDICAL CARE IF: You develop white spots on your skin. This may give the skin a blotchy (mottled) appearance. Your skin turns blue or pale. Your skin becomes waxy or hard. Your swelling gets worse. MAKE SURE YOU:  Understand these instructions. Will watch your condition. Will get help right away if you are not doing well or get worse.   This information is not intended to replace advice given to you by your health care provider. Make sure you discuss any questions you have with your health care provider.   Document Released: 03/07/2011 Document Revised: 08/01/2014 Document Reviewed: 03/07/2011 Elsevier Interactive Patient Education [employer]

## 2015-09-28 NOTE — ED Notes (Signed)
Patient was on a ladder yesterday at 15:00 and fell off the ladder from the ladder slipping. Pt's left foot is swollen, bruised, and has blisters presents on ankle and lower leg. Pt was applying Tiger Balm to the foot/ankle and Motrin. Last dose of Motrin was 800mg  at 17:00 yesterday. Pt is a diabetic.

## 2015-11-17 ENCOUNTER — Emergency Department (HOSPITAL_COMMUNITY): Payer: No Typology Code available for payment source

## 2015-11-17 ENCOUNTER — Emergency Department (HOSPITAL_COMMUNITY)
Admission: EM | Admit: 2015-11-17 | Discharge: 2015-11-18 | Disposition: A | Discharge status: Home / Self Care | Payer: No Typology Code available for payment source | Attending: Emergency Medicine | Admitting: Emergency Medicine

## 2015-11-17 ENCOUNTER — Encounter (HOSPITAL_COMMUNITY): Payer: Self-pay | Admitting: Emergency Medicine

## 2015-11-17 DIAGNOSIS — I10 Essential (primary) hypertension: Secondary | ICD-10-CM | POA: Insufficient documentation

## 2015-11-17 DIAGNOSIS — S8992XA Unspecified injury of left lower leg, initial encounter: Secondary | ICD-10-CM | POA: Insufficient documentation

## 2015-11-17 DIAGNOSIS — Y92410 Unspecified street and highway as the place of occurrence of the external cause: Secondary | ICD-10-CM | POA: Insufficient documentation

## 2015-11-17 DIAGNOSIS — S299XXA Unspecified injury of thorax, initial encounter: Secondary | ICD-10-CM | POA: Insufficient documentation

## 2015-11-17 DIAGNOSIS — S0990XA Unspecified injury of head, initial encounter: Secondary | ICD-10-CM | POA: Diagnosis not present

## 2015-11-17 DIAGNOSIS — Z7982 Long term (current) use of aspirin: Secondary | ICD-10-CM | POA: Insufficient documentation

## 2015-11-17 DIAGNOSIS — S3992XA Unspecified injury of lower back, initial encounter: Secondary | ICD-10-CM | POA: Insufficient documentation

## 2015-11-17 DIAGNOSIS — Z8614 Personal history of Methicillin resistant Staphylococcus aureus infection: Secondary | ICD-10-CM | POA: Insufficient documentation

## 2015-11-17 DIAGNOSIS — M542 Cervicalgia: Secondary | ICD-10-CM

## 2015-11-17 DIAGNOSIS — Y9389 Activity, other specified: Secondary | ICD-10-CM | POA: Insufficient documentation

## 2015-11-17 DIAGNOSIS — M79605 Pain in left leg: Secondary | ICD-10-CM

## 2015-11-17 DIAGNOSIS — S199XXA Unspecified injury of neck, initial encounter: Secondary | ICD-10-CM | POA: Diagnosis present

## 2015-11-17 DIAGNOSIS — Z8669 Personal history of other diseases of the nervous system and sense organs: Secondary | ICD-10-CM | POA: Insufficient documentation

## 2015-11-17 DIAGNOSIS — E119 Type 2 diabetes mellitus without complications: Secondary | ICD-10-CM | POA: Diagnosis not present

## 2015-11-17 DIAGNOSIS — Z8739 Personal history of other diseases of the musculoskeletal system and connective tissue: Secondary | ICD-10-CM | POA: Insufficient documentation

## 2015-11-17 DIAGNOSIS — Y998 Other external cause status: Secondary | ICD-10-CM | POA: Insufficient documentation

## 2015-11-17 DIAGNOSIS — M5489 Other dorsalgia: Secondary | ICD-10-CM

## 2015-11-17 MED ORDER — ACETAMINOPHEN 325 MG PO TABS
650.0000 mg | ORAL_TABLET | Freq: Once | ORAL | Status: AC
  Administered 2015-11-17: 650 mg via ORAL
  Filled 2015-11-17: qty 2
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
## 2015-11-18 MED ORDER — METHOCARBAMOL 500 MG PO TABS: 500.0000 mg | ORAL_TABLET | Freq: Two times a day (BID) | ORAL | Status: AC

## 2015-11-18 MED ORDER — OXYCODONE-ACETAMINOPHEN 5-325 MG PO TABS: 2.0000 | ORAL_TABLET | ORAL | Status: AC | PRN

## 2015-11-18 NOTE — ED Notes (Signed)
Patient able to ambulate independently with limp and orthopedic boot on left foot

## 2015-11-18 NOTE — ED Notes (Signed)
PA at bedside updating patient.

## 2015-11-18 NOTE — Discharge Instructions (Signed)
Take your medications as prescribed as needed for pain relief. You may also take ibuprofen as prescribed over-the-counter to help with pain and swelling. I recommend resting, elevating your left foot and applying ice to affected areas for 15-20 minutes 3-4 times daily. I recommend using ice for the next 2 days, he may then begin applying heat to her back is and for pain relief. Follow-up with your orthopedist, Dr. Eulah PontMurphy at your scheduled appointment on 4/31/17. Please return to the Emergency Department if symptoms worsen or new onset of fever, numbness, tingling, groin numbness, loss of bowel or bladder, weakness, redness, swelling, warmth, drainage.

## 2016-02-09 ENCOUNTER — Emergency Department (HOSPITAL_COMMUNITY)
Admission: EM | Admit: 2016-02-09 | Discharge: 2016-02-10 | Disposition: A | Discharge status: Home / Self Care | Payer: No Typology Code available for payment source | Attending: Emergency Medicine | Admitting: Emergency Medicine

## 2016-02-09 ENCOUNTER — Encounter (HOSPITAL_COMMUNITY): Payer: Self-pay

## 2016-02-09 DIAGNOSIS — S20462A Insect bite (nonvenomous) of left back wall of thorax, initial encounter: Secondary | ICD-10-CM | POA: Insufficient documentation

## 2016-02-09 DIAGNOSIS — E119 Type 2 diabetes mellitus without complications: Secondary | ICD-10-CM | POA: Insufficient documentation

## 2016-02-09 DIAGNOSIS — Z7982 Long term (current) use of aspirin: Secondary | ICD-10-CM | POA: Insufficient documentation

## 2016-02-09 DIAGNOSIS — W57XXXA Bitten or stung by nonvenomous insect and other nonvenomous arthropods, initial encounter: Secondary | ICD-10-CM | POA: Insufficient documentation

## 2016-02-09 DIAGNOSIS — I10 Essential (primary) hypertension: Secondary | ICD-10-CM | POA: Insufficient documentation

## 2016-02-09 DIAGNOSIS — Y929 Unspecified place or not applicable: Secondary | ICD-10-CM | POA: Insufficient documentation

## 2016-02-09 DIAGNOSIS — Y939 Activity, unspecified: Secondary | ICD-10-CM | POA: Insufficient documentation

## 2016-02-09 DIAGNOSIS — Z87891 Personal history of nicotine dependence: Secondary | ICD-10-CM | POA: Insufficient documentation

## 2016-02-09 DIAGNOSIS — Y999 Unspecified external cause status: Secondary | ICD-10-CM | POA: Insufficient documentation

## 2016-02-09 MED ORDER — DOXYCYCLINE HYCLATE 100 MG PO CAPS: 100.0000 mg | ORAL_CAPSULE | Freq: Two times a day (BID) | ORAL | Status: AC

## 2016-02-09 MED ORDER — DOXYCYCLINE HYCLATE 100 MG PO TABS
100.0000 mg | ORAL_TABLET | Freq: Once | ORAL | Status: AC
  Administered 2016-02-10: 100 mg via ORAL
  Filled 2016-02-09: qty 1

## 2016-02-09 NOTE — Discharge Instructions (Signed)
Tick Bite Information Ticks are insects that attach themselves to the skin and draw blood for food. There are various types of ticks. Common types include wood ticks and deer ticks. Most ticks live in shrubs and grassy areas. Ticks can climb onto your body when you make contact with leaves or grass where the tick is waiting. The most common places on the body for ticks to attach themselves are the scalp, neck, armpits, waist, and groin. Most tick bites are harmless, but sometimes ticks carry germs that cause diseases. These germs can be spread to a person during the tick's feeding process. The chance of a disease spreading through a tick bite depends on:   The type of tick.  Time of year.   How long the tick is attached.   Geographic location.  HOW CAN YOU PREVENT TICK BITES? Take these steps to help prevent tick bites when you are outdoors:  Wear protective clothing. Long sleeves and long pants are best.   Wear white clothes so you can see ticks more easily.  Tuck your pant legs into your socks.   If walking on a trail, stay in the middle of the trail to avoid brushing against bushes.  Avoid walking through areas with long grass.  Put insect repellent on all exposed skin and along boot tops, pant legs, and sleeve cuffs.   Check clothing, hair, and skin repeatedly and before going inside.   Brush off any ticks that are not attached.  Take a shower or bath as soon as possible after being outdoors.  WHAT IS THE PROPER WAY TO REMOVE A TICK? Ticks should be removed as soon as possible to help prevent diseases caused by tick bites. 1. If latex gloves are available, put them on before trying to remove a tick.  2. Using fine-point tweezers, grasp the tick as close to the skin as possible. You may also use curved forceps or a tick removal tool. Grasp the tick as close to its head as possible. Avoid grasping the tick on its body. 3. Pull gently with steady upward pressure until  the tick lets go. Do not twist the tick or jerk it suddenly. This may break off the tick's head or mouth parts. 4. Do not squeeze or crush the tick's body. This could force disease-carrying fluids from the tick into your body.  5. After the tick is removed, wash the bite area and your hands with soap and water or other disinfectant such as alcohol. 6. Apply a small amount of antiseptic cream or ointment to the bite site.  7. Wash and disinfect any instruments that were used.  Do not try to remove a tick by applying a hot match, petroleum jelly, or fingernail polish to the tick. These methods do not work and may increase the chances of disease being spread from the tick bite.  WHEN SHOULD YOU SEEK MEDICAL CARE? Contact your health care provider if you are unable to remove a tick from your skin or if a part of the tick breaks off and is stuck in the skin.  After a tick bite, you need to be aware of signs and symptoms that could be related to diseases spread by ticks. Contact your health care provider if you develop any of the following in the days or weeks after the tick bite:  Unexplained fever.  Rash. A circular rash that appears days or weeks after the tick bite may indicate the possibility of Lyme disease. The rash may resemble   a target with a bull's-eye and may occur at a different part of your body than the tick bite.  Redness and swelling in the area of the tick bite.   Tender, swollen lymph glands.   Diarrhea.   Weight loss.   Cough.   Fatigue.   Muscle, joint, or bone pain.   Abdominal pain.   Headache.   Lethargy or a change in your level of consciousness.  Difficulty walking or moving your legs.   Numbness in the legs.   Paralysis.  Shortness of breath.   Confusion.   Repeated vomiting.    This information is not intended to replace advice given to you by your health care provider. Make sure you discuss any questions you have with your health  care provider.   Document Released: 07/08/2000 Document Revised: 08/01/2014 Document Reviewed: 12/19/2012 [employer] Interactive Patient Education 2016 [employer]  
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
## 2016-02-09 NOTE — ED Notes (Signed)
Per pt states he has a tick on him on Friday and started feeling bad; pt states he has HA and stiffness in shoulders; pt is unsure where tick was but believes it was on his back; pt unsure of tick was removed; pt also presents with new small red bumps on lower right leg; Pt states pain at 6/10 on arrival mostly in his head; Pt a&ox 4 on arrival. Pt states he has hx of DM

## 2016-02-10 NOTE — ED Notes (Signed)
Pt educated that he would hear from flow manager if his blood test/urine tests for lime disease and rocky [place] spotted fever were positive. Pt to follow up with pcp or here within 3 days pending the lab tests. Pt stable and NAD. Pt has no further questions.

## 2016-02-12 LAB — ROCKY MTN SPOTTED FVR ABS PNL(IGG+IGM)
RMSF IGG: NEGATIVE
RMSF IgM: 0.27 index (ref 0.00–0.89)

## 2016-02-19 LAB — MISCELLANEOUS TEST

## 2016-02-22 ENCOUNTER — Telehealth (HOSPITAL_COMMUNITY): Payer: Self-pay
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
